# Patient Record
Sex: Female | Born: 1960 | Race: White | Hispanic: No | Marital: Single | Smoking: Current every day smoker
Health system: Southern US, Community
[De-identification: ages and names within clinical notes are randomized; demographics above are authoritative.]

## PROBLEM LIST (undated history)

## (undated) DIAGNOSIS — I10 Essential (primary) hypertension: Secondary | ICD-10-CM

## (undated) DIAGNOSIS — IMO0002 Reserved for concepts with insufficient information to code with codable children: Secondary | ICD-10-CM

## (undated) DIAGNOSIS — F32A Depression, unspecified: Secondary | ICD-10-CM

## (undated) DIAGNOSIS — Z86718 Personal history of other venous thrombosis and embolism: Secondary | ICD-10-CM

## (undated) DIAGNOSIS — F329 Major depressive disorder, single episode, unspecified: Secondary | ICD-10-CM

## (undated) DIAGNOSIS — M329 Systemic lupus erythematosus, unspecified: Secondary | ICD-10-CM

## (undated) HISTORY — PX: CHOLECYSTECTOMY: SHX55

## (undated) HISTORY — PX: APPENDECTOMY: SHX54

---
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
## 2019-02-23 ENCOUNTER — Encounter (HOSPITAL_COMMUNITY): Payer: Self-pay

## 2019-02-23 ENCOUNTER — Other Ambulatory Visit: Payer: Self-pay

## 2019-02-23 ENCOUNTER — Emergency Department (HOSPITAL_COMMUNITY): Payer: Medicare (Managed Care)

## 2019-02-23 ENCOUNTER — Inpatient Hospital Stay (HOSPITAL_COMMUNITY)
Admission: EM | Admit: 2019-02-23 | Discharge: 2019-02-25 | DRG: 392 | Disposition: A | Discharge status: Home / Self Care | Payer: Medicare (Managed Care) | Attending: Internal Medicine | Admitting: Internal Medicine

## 2019-02-23 DIAGNOSIS — Z7901 Long term (current) use of anticoagulants: Secondary | ICD-10-CM

## 2019-02-23 DIAGNOSIS — Z86718 Personal history of other venous thrombosis and embolism: Secondary | ICD-10-CM

## 2019-02-23 DIAGNOSIS — N179 Acute kidney failure, unspecified: Secondary | ICD-10-CM | POA: Diagnosis present

## 2019-02-23 DIAGNOSIS — R112 Nausea with vomiting, unspecified: Secondary | ICD-10-CM | POA: Diagnosis not present

## 2019-02-23 DIAGNOSIS — M329 Systemic lupus erythematosus, unspecified: Secondary | ICD-10-CM | POA: Diagnosis present

## 2019-02-23 DIAGNOSIS — F419 Anxiety disorder, unspecified: Secondary | ICD-10-CM | POA: Diagnosis present

## 2019-02-23 DIAGNOSIS — G43909 Migraine, unspecified, not intractable, without status migrainosus: Secondary | ICD-10-CM | POA: Diagnosis present

## 2019-02-23 DIAGNOSIS — I82409 Acute embolism and thrombosis of unspecified deep veins of unspecified lower extremity: Secondary | ICD-10-CM | POA: Diagnosis present

## 2019-02-23 DIAGNOSIS — Z23 Encounter for immunization: Secondary | ICD-10-CM

## 2019-02-23 DIAGNOSIS — Z20828 Contact with and (suspected) exposure to other viral communicable diseases: Secondary | ICD-10-CM | POA: Diagnosis present

## 2019-02-23 DIAGNOSIS — Z86711 Personal history of pulmonary embolism: Secondary | ICD-10-CM

## 2019-02-23 DIAGNOSIS — IMO0002 Reserved for concepts with insufficient information to code with codable children: Secondary | ICD-10-CM | POA: Diagnosis present

## 2019-02-23 DIAGNOSIS — E871 Hypo-osmolality and hyponatremia: Secondary | ICD-10-CM | POA: Diagnosis present

## 2019-02-23 DIAGNOSIS — Z79899 Other long term (current) drug therapy: Secondary | ICD-10-CM

## 2019-02-23 DIAGNOSIS — Z7951 Long term (current) use of inhaled steroids: Secondary | ICD-10-CM

## 2019-02-23 DIAGNOSIS — E876 Hypokalemia: Secondary | ICD-10-CM | POA: Diagnosis not present

## 2019-02-23 DIAGNOSIS — F17210 Nicotine dependence, cigarettes, uncomplicated: Secondary | ICD-10-CM | POA: Diagnosis present

## 2019-02-23 DIAGNOSIS — I10 Essential (primary) hypertension: Secondary | ICD-10-CM | POA: Diagnosis present

## 2019-02-23 DIAGNOSIS — Z9049 Acquired absence of other specified parts of digestive tract: Secondary | ICD-10-CM

## 2019-02-23 DIAGNOSIS — F12988 Cannabis use, unspecified with other cannabis-induced disorder: Secondary | ICD-10-CM | POA: Diagnosis present

## 2019-02-23 HISTORY — DX: Reserved for concepts with insufficient information to code with codable children: IMO0002

## 2019-02-23 HISTORY — DX: Personal history of other venous thrombosis and embolism: Z86.718

## 2019-02-23 HISTORY — DX: Depression, unspecified: F32.A

## 2019-02-23 HISTORY — DX: Essential (primary) hypertension: I10

## 2019-02-23 HISTORY — DX: Systemic lupus erythematosus, unspecified: M32.9

## 2019-02-23 LAB — URINALYSIS, ROUTINE W REFLEX MICROSCOPIC
Bilirubin Urine: NEGATIVE
Glucose, UA: NEGATIVE mg/dL
Ketones, ur: 80 mg/dL — AB
Leukocytes,Ua: NEGATIVE
Nitrite: NEGATIVE
Protein, ur: 30 mg/dL — AB
Specific Gravity, Urine: 1.016 (ref 1.005–1.030)
pH: 6 (ref 5.0–8.0)

## 2019-02-23 LAB — COMPREHENSIVE METABOLIC PANEL
ALT: 24 U/L (ref 0–44)
AST: 36 U/L (ref 15–41)
Albumin: 4.6 g/dL (ref 3.5–5.0)
Alkaline Phosphatase: 100 U/L (ref 38–126)
Anion gap: 23 — ABNORMAL HIGH (ref 5–15)
BUN: 59 mg/dL — ABNORMAL HIGH (ref 6–20)
CO2: 39 mmol/L — ABNORMAL HIGH (ref 22–32)
Calcium: 9.5 mg/dL (ref 8.9–10.3)
Chloride: 70 mmol/L — ABNORMAL LOW (ref 98–111)
Creatinine, Ser: 1.45 mg/dL — ABNORMAL HIGH (ref 0.44–1.00)
GFR calc Af Amer: 46 mL/min — ABNORMAL LOW (ref >60)
GFR calc non Af Amer: 39 mL/min — ABNORMAL LOW (ref >60)
Glucose, Bld: 104 mg/dL — ABNORMAL HIGH (ref 70–99)
Potassium: <2 mmol/L — CL (ref 3.5–5.1)
Sodium: 132 mmol/L — ABNORMAL LOW (ref 135–145)
Total Bilirubin: 3 mg/dL — ABNORMAL HIGH (ref 0.3–1.2)
Total Protein: 8.5 g/dL — ABNORMAL HIGH (ref 6.5–8.1)

## 2019-02-23 LAB — I-STAT BETA HCG BLOOD, ED (MC, WL, AP ONLY): I-stat hCG, quantitative: <5 m[IU]/mL (ref <5)

## 2019-02-23 LAB — CBC
HCT: 43.8 % (ref 36.0–46.0)
Hemoglobin: 15.2 g/dL — ABNORMAL HIGH (ref 12.0–15.0)
MCH: 32 pg (ref 26.0–34.0)
MCHC: 34.7 g/dL (ref 30.0–36.0)
MCV: 92.2 fL (ref 80.0–100.0)
Platelets: 289 10*3/uL (ref 150–400)
RBC: 4.75 MIL/uL (ref 3.87–5.11)
RDW: 12.3 % (ref 11.5–15.5)
WBC: 9.1 10*3/uL (ref 4.0–10.5)
nRBC: 0 % (ref 0.0–0.2)

## 2019-02-23 LAB — MAGNESIUM: Magnesium: 2.8 mg/dL — ABNORMAL HIGH (ref 1.7–2.4)

## 2019-02-23 LAB — LIPASE, BLOOD: Lipase: 26 U/L (ref 11–51)

## 2019-02-23 MED ORDER — ACETAMINOPHEN 650 MG RE SUPP: 650.0000 mg | Freq: Four times a day (QID) | RECTAL | Status: DC | PRN

## 2019-02-23 MED ORDER — ACETAMINOPHEN 325 MG PO TABS: 650.0000 mg | ORAL_TABLET | Freq: Four times a day (QID) | ORAL | Status: DC | PRN

## 2019-02-23 MED ORDER — METOCLOPRAMIDE HCL 5 MG/ML IJ SOLN
10.0000 mg | Freq: Once | INTRAMUSCULAR | Status: AC
  Administered 2019-02-23: 10 mg via INTRAVENOUS
  Filled 2019-02-23: qty 2

## 2019-02-23 MED ORDER — PANTOPRAZOLE SODIUM 40 MG IV SOLR
40.0000 mg | Freq: Once | INTRAVENOUS | Status: AC
  Administered 2019-02-23: 40 mg via INTRAVENOUS
  Filled 2019-02-23: qty 40

## 2019-02-23 MED ORDER — SODIUM CHLORIDE 0.9 % IV BOLUS
1000.0000 mL | Freq: Once | INTRAVENOUS | Status: AC
  Administered 2019-02-23: 19:00:00 1000 mL via INTRAVENOUS

## 2019-02-23 MED ORDER — IOHEXOL 300 MG/ML  SOLN
75.0000 mL | Freq: Once | INTRAMUSCULAR | Status: AC | PRN
  Administered 2019-02-23: 75 mL via INTRAVENOUS

## 2019-02-23 MED ORDER — POTASSIUM CHLORIDE 10 MEQ/100ML IV SOLN: 10.0000 meq | INTRAVENOUS | Status: DC

## 2019-02-23 MED ORDER — FENTANYL CITRATE (PF) 100 MCG/2ML IJ SOLN
50.0000 ug | Freq: Once | INTRAMUSCULAR | Status: AC
  Administered 2019-02-23: 23:00:00 50 ug via INTRAVENOUS
  Filled 2019-02-23: qty 2

## 2019-02-23 MED ORDER — ONDANSETRON HCL 4 MG PO TABS: 4.0000 mg | ORAL_TABLET | Freq: Four times a day (QID) | ORAL | Status: DC | PRN

## 2019-02-23 MED ORDER — POTASSIUM CHLORIDE 10 MEQ/100ML IV SOLN
10.0000 meq | INTRAVENOUS | Status: AC
  Administered 2019-02-23 (×3): 10 meq via INTRAVENOUS
  Filled 2019-02-23 (×3): qty 100

## 2019-02-23 MED ORDER — POTASSIUM CHLORIDE 2 MEQ/ML IV SOLN
INTRAVENOUS | Status: AC
  Administered 2019-02-24 (×2): via INTRAVENOUS
  Filled 2019-02-23 (×3): qty 1000

## 2019-02-23 MED ORDER — FENTANYL CITRATE (PF) 100 MCG/2ML IJ SOLN
25.0000 ug | INTRAMUSCULAR | Status: DC | PRN
  Administered 2019-02-24 (×3): 25 ug via INTRAVENOUS
  Filled 2019-02-23 (×4): qty 2

## 2019-02-23 MED ORDER — POTASSIUM CHLORIDE CRYS ER 20 MEQ PO TBCR
40.0000 meq | EXTENDED_RELEASE_TABLET | Freq: Once | ORAL | Status: AC
  Administered 2019-02-23: 40 meq via ORAL
  Filled 2019-02-23: qty 2

## 2019-02-23 MED ORDER — ONDANSETRON HCL 4 MG/2ML IJ SOLN
4.0000 mg | Freq: Once | INTRAMUSCULAR | Status: AC
  Administered 2019-02-23: 4 mg via INTRAVENOUS
  Filled 2019-02-23: qty 2

## 2019-02-23 MED ORDER — ONDANSETRON HCL 4 MG/2ML IJ SOLN
4.0000 mg | Freq: Four times a day (QID) | INTRAMUSCULAR | Status: DC | PRN
  Administered 2019-02-24 (×2): 4 mg via INTRAVENOUS
  Filled 2019-02-23 (×3): qty 2

## 2019-02-23 MED ORDER — LABETALOL HCL 5 MG/ML IV SOLN
10.0000 mg | INTRAVENOUS | Status: DC | PRN
  Filled 2019-02-23: qty 4

## 2019-02-23 MED ORDER — UMECLIDINIUM BROMIDE 62.5 MCG/INH IN AEPB
1.0000 | INHALATION_SPRAY | Freq: Every day | RESPIRATORY_TRACT | Status: DC
  Administered 2019-02-24 – 2019-02-25 (×2): 1 via RESPIRATORY_TRACT
  Filled 2019-02-23: qty 7

## 2019-02-23 MED ORDER — MOMETASONE FURO-FORMOTEROL FUM 100-5 MCG/ACT IN AERO
2.0000 | INHALATION_SPRAY | Freq: Two times a day (BID) | RESPIRATORY_TRACT | Status: DC
  Administered 2019-02-24 – 2019-02-25 (×2): 2 via RESPIRATORY_TRACT
  Filled 2019-02-23: qty 8.8

## 2019-02-23 MED ORDER — ALBUTEROL SULFATE HFA 108 (90 BASE) MCG/ACT IN AERS: 1.0000 | INHALATION_SPRAY | Freq: Four times a day (QID) | RESPIRATORY_TRACT | Status: DC | PRN

## 2019-02-23 NOTE — ED Notes (Signed)
EKG given to EDP,Belfi,MD., for review. 

## 2019-02-23 NOTE — ED Provider Notes (Deleted)
[facility]-EMERGENCY DEPT Provider Note   CSN: [id number] Arrival date & time: 02/23/19  1749     History   Chief Complaint Chief Complaint  Patient presents with  . Emesis    for past 5 days    HPI Kathleen Gomez is a 58 y.o. female.     HPI  Past Medical History:  Diagnosis Date  . Depression   . H/O blood clots   . Hypertension   . Lupus (HCC)     There are no active problems to display for this patient.   Past Surgical History:  Procedure Laterality Date  . APPENDECTOMY    . CHOLECYSTECTOMY       OB History   No obstetric history on file.      Home Medications    Prior to Admission medications   Medication Sig Start Date End Date Taking? Authorizing Provider  albuterol (VENTOLIN HFA) 108 (90 Base) MCG/ACT inhaler Inhale 1 puff into the lungs every 6 (six) hours as needed for wheezing or shortness of breath.  01/23/19  Yes [provider]  ALPRAZolam Prudy Feeler(XANAX) 0.5 MG tablet Take 0.5 mg by mouth 2 (two) times daily as needed for anxiety or sleep.  02/01/19  Yes [provider]  cloNIDine (CATAPRES) 0.1 MG tablet Take 0.1 mg by mouth 2 (two) times daily. 12/30/18  Yes [provider]  escitalopram (LEXAPRO) 10 MG tablet Take 10 mg by mouth daily. 01/23/19  Yes [provider]  gabapentin (NEURONTIN) 600 MG tablet Take 600 mg by mouth 3 (three) times daily. 12/30/18  Yes [provider]  mirtazapine (REMERON) 45 MG tablet Take 45 mg by mouth at bedtime. 01/24/19  Yes [provider]  potassium chloride (MICRO-K) 10 MEQ CR capsule Take 10 mEq by mouth daily. 01/23/19  Yes [provider]  pravastatin (PRAVACHOL) 10 MG tablet Take 10 mg by mouth at bedtime. 11/28/18  Yes [provider]  SPIRIVA HANDIHALER 18 MCG inhalation capsule Place 1 puff into inhaler and inhale daily. 01/24/19  Yes [provider]  SYMBICORT 80-4.5 MCG/ACT inhaler Inhale 1 puff into the lungs 2  (two) times daily. 01/23/19  Yes [provider]  topiramate (TOPAMAX) 25 MG tablet Take 25 mg by mouth at bedtime. 01/23/19  Yes [provider]  XARELTO 20 MG TABS tablet Take 20 mg by mouth daily. 02/23/19  Yes [provider]    Family History History reviewed. No pertinent family history.  Social History Social History   Tobacco Use  . Smoking status: Current Every Day Smoker    Packs/day: 1.00    Types: Cigarettes  . Smokeless tobacco: Never Used  Substance Use Topics  . Alcohol use: Not Currently  . Drug use: Yes    Types: Marijuana     Allergies   Patient has no known allergies.   Review of Systems Review of Systems   Physical Exam Updated Vital Signs BP (!) 145/65 (BP Location: Left Arm)   Pulse 96   Temp 98.3 F (36.8 C) (Oral)   Resp 18   SpO2 93%   Physical Exam   ED Treatments / Results  Labs (all labs ordered are listed, but only abnormal results are displayed) Labs Reviewed  COMPREHENSIVE METABOLIC PANEL - Abnormal; Notable for the following components:      Result Value   Sodium 132 (*)    Potassium <2.0 (*)    Chloride 70 (*)    CO2 39 (*)  Glucose, Bld 104 (*)    BUN 59 (*)    Creatinine, Ser 1.45 (*)    Total Protein 8.5 (*)    Total Bilirubin 3.0 (*)    GFR calc non Af Amer 39 (*)    GFR calc Af Amer 46 (*)    Anion gap 23 (*)    All other components within normal limits  CBC - Abnormal; Notable for the following components:   Hemoglobin 15.2 (*)    All other components within normal limits  URINALYSIS, ROUTINE W REFLEX MICROSCOPIC - Abnormal; Notable for the following components:   Hgb urine dipstick MODERATE (*)    Ketones, ur 80 (*)    Protein, ur 30 (*)    Bacteria, UA RARE (*)    All other components within normal limits  MAGNESIUM - Abnormal; Notable for the following components:   Magnesium 2.8 (*)    All other components within normal limits  SARS CORONAVIRUS 2 (TAT 6-24 HRS)  LIPASE,  BLOOD  I-STAT BETA HCG BLOOD, ED ([facility], [facility], [facility] ONLY)    EKG None  Radiology Ct Abdomen Pelvis W Contrast  Result Date: 02/23/2019 CLINICAL DATA:  Abdominal pain EXAM: CT ABDOMEN AND PELVIS WITH CONTRAST TECHNIQUE: Multidetector CT imaging of the abdomen and pelvis was performed using the standard protocol following bolus administration of intravenous contrast. CONTRAST:  39mL OMNIPAQUE IOHEXOL 300 MG/ML  SOLN COMPARISON:  None. FINDINGS: Lower chest: Lung bases are clear. No effusions. Heart is normal size. Hepatobiliary: Prior cholecystectomy. Diffuse fatty infiltration of the liver. No focal hepatic abnormality. Mild intrahepatic and extrahepatic biliary prominence, likely related to post cholecystectomy state. Pancreas: No focal abnormality or ductal dilatation. Spleen: No focal abnormality.  Normal size. Adrenals/Urinary Tract: No adrenal abnormality. No focal renal abnormality. No stones or hydronephrosis. Urinary bladder is unremarkable. Stomach/Bowel: Stomach, large and small bowel grossly unremarkable. Prior appendectomy. Vascular/Lymphatic: Aortic atherosclerosis. No enlarged abdominal or pelvic lymph nodes. IVC filter in place. Reproductive: Uterus and adnexa unremarkable.  No mass. Other: No free fluid or free air. Musculoskeletal: No acute bony abnormality. IMPRESSION: Fatty infiltration of the liver. Aortic atherosclerosis. No acute findings. Electronically Signed   By: Rolm Baptise M.D.   On: 02/23/2019 21:39    Procedures Procedures (including critical care time)  Medications Ordered in ED Medications  potassium chloride 10 mEq in 100 mL IVPB (10 mEq Intravenous New Bag/Given 02/23/19 2235)  sodium chloride 0.9 % bolus 1,000 mL (0 mLs Intravenous Stopped 02/23/19 1902)  ondansetron (ZOFRAN) injection 4 mg (4 mg Intravenous Given 02/23/19 1833)  pantoprazole (PROTONIX) injection 40 mg (40 mg Intravenous Given 02/23/19 1833)  potassium chloride SA (KLOR-CON) CR tablet 40 mEq (40 mEq  Oral Given 02/23/19 2240)  iohexol (OMNIPAQUE) 300 MG/ML solution 75 mL (75 mLs Intravenous Contrast Given 02/23/19 2122)  metoCLOPramide (REGLAN) injection 10 mg (10 mg Intravenous Given 02/23/19 2237)  fentaNYL (SUBLIMAZE) injection 50 mcg (50 mcg Intravenous Given 02/23/19 2234)     Initial Impression / Assessment and Plan / ED Course  I have reviewed the triage vital signs and the nursing notes.  Pertinent labs & imaging results that were available during my care of the patient were reviewed by me and considered in my medical decision making (see chart for details).        Patient is a 58 year old female who presents with nausea vomiting and upper abdominal pain.  She was noted to have a marked hypokalemia with a potassium less than 2.  She was  started on IV and oral potassium supplementation.  Her magnesium level is okay.  CT scan of her abdomen pelvis show no acute abnormalities.  She has a mild elevation in her creatinine.  She was given IV fluids and antiemetics.  She still feels nauseated.  She was also given some pain medication for her abdominal pain.  She states that she has had problems in the past where she is had ongoing vomiting and she does not know what is caused it but she is had to be admitted for it in the past in [place].  She denies any recent fevers or other Covid symptoms.  I spoke with Dr. Toniann Fail who admit the patient for further treatment  CRITICAL CARE Performed by: Rolan Bucco Total critical care time: 45 minutes Critical care time was exclusive of separately billable procedures and treating other patients. Critical care was necessary to treat or prevent imminent or life-threatening deterioration. Critical care was time spent personally by me on the following activities: development of treatment plan with patient and/or surrogate as well as nursing, discussions with consultants, evaluation of patient's response to treatment, examination of patient, obtaining  history from patient or surrogate, ordering and performing treatments and interventions, ordering and review of laboratory studies, ordering and review of radiographic studies, pulse oximetry and re-evaluation of patient's condition.  Kenley Rettinger was evaluated in Emergency Department on 02/23/2019 for the symptoms described in the history of present illness. She was evaluated in the context of the global COVID-19 pandemic, which necessitated consideration that the patient might be at risk for infection with the SARS-CoV-2 virus that causes COVID-19. Institutional protocols and algorithms that pertain to the evaluation of patients at risk for COVID-19 are in a state of rapid change based on information released by regulatory bodies including the CDC and federal and state organizations. These policies and algorithms were followed during the patient's care in the ED.    Final Clinical Impressions(s) / ED Diagnoses   Final diagnoses:  Intractable vomiting with nausea, unspecified vomiting type  Hypokalemia    ED Discharge Orders    None       Rolan Bucco, MD 02/23/19 2243

## 2019-02-23 NOTE — Progress Notes (Signed)
ANTICOAGULATION CONSULT NOTE - Initial Consult  Pharmacy Consult for IV heparin Indication: DVT ( on PTA xarelto)  No Known Allergies  Patient Measurements:   Heparin Dosing Weight: 63.8 (actual)  Vital Signs: Temp: 98.3 F (36.8 C) (11/29 1814) Temp Source: Oral (11/29 1814) BP: 151/85 (11/29 2230) Pulse Rate: 100 (11/29 2230)  Labs: Recent Labs    02/23/19 1808  HGB 15.2*  HCT 43.8  PLT 289  CREATININE 1.45*    CrCl cannot be calculated (Unknown ideal weight.).   Medical History: Past Medical History:  Diagnosis Date  . Depression   . H/O blood clots   . Hypertension   . Lupus (HCC)     Medications:  Scheduled:  . mometasone-formoterol  2 puff Inhalation BID  . [START ON 02/24/2019] tiotropium  18 mcg Inhalation Daily   Infusions:  . lactated ringers 1,000 mL with potassium chloride 40 mEq infusion    . potassium chloride 10 mEq (02/23/19 2235)    Assessment: 17 yoF c/o emesis x 5 day on chronic xarelto for hx of DVTs.  LD xarelto last week Baseline labs: H/H, plts WNL, aptt =22 , INR =1, HL = <0.10  Goal of Therapy:  Heparin level 0.3-0.7 units/ml aPTT 66-102 seconds Monitor platelets by anticoagulation protocol: Yes   Plan:  Baselines pending, but per med rec patient has not had xarelto in a few days Heparin 2000 unit bolus x1 Start drip at 1100 units/hr Daily CBC/HL Check 1st HL in 6 hours  Dorrene German 02/23/2019,11:07 PM

## 2019-02-23 NOTE — ED Notes (Signed)
Pt. Documented in error see above note in chart. 

## 2019-02-23 NOTE — H&P (Addendum)
History and Physical    Kathleen Gomez RDE:[id number] DOB: Oct 04, 1960 DOA: 02/23/2019  PCP: System, Pcp Not In  Patient coming from: Home.  Chief Complaint: Abdominal pain nausea vomiting.  HPI: Kathleen Gomez is a 58 y.o. female with history of lupus, hypertension, DVT/PE, depression anxiety and bronchitis presents to the ER with complaints of having persistent abdominal pain with nausea vomiting unable to keep anything.  Patient has been having abdominal pain for the last 2 weeks over the last 5 days has been having persistent vomiting but denies any diarrhea.  Unable to eat anything.  Pain is diffuse.  Denies any blood in the vomitus.  Patient states he had a similar episode 3 years ago when patient was in [place].  At that time patient had EGD and colonoscopy and as per the patient it was unremarkable.  We do not have any access to her records.  Patient just moved from [place] 2 months ago.  ED Course: In the ER patient had diffuse abdominal pain with CT scan of the abdomen pelvis showing nothing acute.  No potassium less than 2 EKG showing sinus tachycardia with nonspecific ST-T changes creatinine 1.4 total bilirubin of 3 lipase of 26 platelets 289 hemoglobin 15.2 WBC 9.1 EKG sinus tachycardia COVID-19 test is pending.  Patient was started on IV fluids pain relief medication and admitted for further management.  Urine drug screen is positive for marijuana.  Review of Systems: As per HPI, rest all negative.   Past Medical History:  Diagnosis Date  . Depression   . H/O blood clots   . Hypertension   . Lupus [facility])     Past Surgical History:  Procedure Laterality Date  . APPENDECTOMY    . CHOLECYSTECTOMY       reports that she has been smoking cigarettes. She has been smoking about 1.00 pack per day. She has never used smokeless tobacco. She reports previous alcohol use. She reports current drug use. Drug: Marijuana.  No Known Allergies  Family History  Problem Relation Age of  Onset  . Diabetes Mellitus II Father     Prior to Admission medications   Medication Sig Start Date End Date Taking? Authorizing Provider  albuterol (VENTOLIN HFA) 108 (90 Base) MCG/ACT inhaler Inhale 1 puff into the lungs every 6 (six) hours as needed for wheezing or shortness of breath.  01/23/19  Yes [provider]  ALPRAZolam Duanne Moron) 0.5 MG tablet Take 0.5 mg by mouth 2 (two) times daily as needed for anxiety or sleep.  02/01/19  Yes [provider]  cloNIDine (CATAPRES) 0.1 MG tablet Take 0.1 mg by mouth 2 (two) times daily. 12/30/18  Yes [provider]  escitalopram (LEXAPRO) 10 MG tablet Take 10 mg by mouth daily. 01/23/19  Yes [provider]  gabapentin (NEURONTIN) 600 MG tablet Take 600 mg by mouth 3 (three) times daily. 12/30/18  Yes [provider]  mirtazapine (REMERON) 45 MG tablet Take 45 mg by mouth at bedtime. 01/24/19  Yes [provider]  potassium chloride (MICRO-K) 10 MEQ CR capsule Take 10 mEq by mouth daily. 01/23/19  Yes [provider]  pravastatin (PRAVACHOL) 10 MG tablet Take 10 mg by mouth at bedtime. 11/28/18  Yes [provider]  SPIRIVA HANDIHALER 18 MCG inhalation capsule Place 1 puff into inhaler and inhale daily. 01/24/19  Yes [provider]  SYMBICORT 80-4.5 MCG/ACT inhaler Inhale 1 puff into the lungs 2 (two) times daily. 01/23/19  Yes [provider]  topiramate (TOPAMAX) 25 MG tablet Take 25 mg by mouth at bedtime. 01/23/19  Yes [provider]  XARELTO 20 MG TABS tablet Take 20 mg by mouth daily. 02/23/19  Yes [provider]    Physical Exam: Constitutional: Moderately built and nourished. Vitals:   02/23/19 1814 02/23/19 1830 02/23/19 2150 02/23/19 2230  BP: (!) 147/106 (!) 156/94 (!) 145/65 (!) 151/85  Pulse: (!) 108 (!) 113 96 100  Resp: 17  18 18   Temp: 98.3 F (36.8 C)     TempSrc: Oral     SpO2: 91% 92% 93% 94%   Eyes: Anicteric no  pallor. ENMT: No discharge from the ears eyes nose or mouth. Neck: No mass felt.  No neck rigidity. Respiratory: No rhonchi or crepitations. Cardiovascular: S1-S2 heard. Abdomen: Soft nontender bowel sounds present. Musculoskeletal: No edema bilateral effusion. Skin: No rash. Neurologic: Alert awake oriented to time place and person.  Moves all extremities. Psychiatric: Appears normal per normal affect.   Labs on Admission: I have personally reviewed following labs and imaging studies  CBC: Recent Labs  Lab 02/23/19 1808  WBC 9.1  HGB 15.2*  HCT 43.8  MCV 92.2  PLT 289   Basic Metabolic Panel: Recent Labs  Lab 02/23/19 1808  NA 132*  K <2.0*  CL 70*  CO2 39*  GLUCOSE 104*  BUN 59*  CREATININE 1.45*  CALCIUM 9.5  MG 2.8*   GFR: CrCl cannot be calculated (Unknown ideal weight.). Liver Function Tests: Recent Labs  Lab 02/23/19 1808  AST 36  ALT 24  ALKPHOS 100  BILITOT 3.0*  PROT 8.5*  ALBUMIN 4.6   Recent Labs  Lab 02/23/19 1808  LIPASE 26   No results for input(s): AMMONIA in the last 168 hours. Coagulation Profile: No results for input(s): INR, PROTIME in the last 168 hours. Cardiac Enzymes: No results for input(s): CKTOTAL, CKMB, CKMBINDEX, TROPONINI in the last 168 hours. BNP (last 3 results) No results for input(s): PROBNP in the last 8760 hours. HbA1C: No results for input(s): HGBA1C in the last 72 hours. CBG: No results for input(s): GLUCAP in the last 168 hours. Lipid Profile: No results for input(s): CHOL, HDL, LDLCALC, TRIG, CHOLHDL, LDLDIRECT in the last 72 hours. Thyroid Function Tests: No results for input(s): TSH, T4TOTAL, FREET4, T3FREE, THYROIDAB in the last 72 hours. Anemia Panel: No results for input(s): VITAMINB12, FOLATE, FERRITIN, TIBC, IRON, RETICCTPCT in the last 72 hours. Urine analysis:    Component Value Date/Time   COLORURINE YELLOW 02/23/2019 1808   APPEARANCEUR CLEAR 02/23/2019 1808   LABSPEC 1.016 02/23/2019  1808   PHURINE 6.0 02/23/2019 1808   GLUCOSEU NEGATIVE 02/23/2019 1808   HGBUR MODERATE (A) 02/23/2019 1808   BILIRUBINUR NEGATIVE 02/23/2019 1808   KETONESUR 80 (A) 02/23/2019 1808   PROTEINUR 30 (A) 02/23/2019 1808   NITRITE NEGATIVE 02/23/2019 1808   LEUKOCYTESUR NEGATIVE 02/23/2019 1808   Sepsis Labs: @LABRCNTIP (procalcitonin:4,lacticidven:4) )No results found for this or any previous visit (from the past 240 hour(s)).   Radiological Exams on Admission: Ct Abdomen Pelvis W Contrast  Result Date: 02/23/2019 CLINICAL DATA:  Abdominal pain EXAM: CT ABDOMEN AND PELVIS WITH CONTRAST TECHNIQUE: Multidetector CT imaging of the abdomen and pelvis was performed using the standard protocol following bolus administration of intravenous contrast. CONTRAST:  26mL OMNIPAQUE IOHEXOL 300 MG/ML  SOLN COMPARISON:  None. FINDINGS: Lower chest: Lung bases are clear. No effusions. Heart is normal size. Hepatobiliary: Prior cholecystectomy. Diffuse fatty infiltration of the liver. No focal hepatic  abnormality. Mild intrahepatic and extrahepatic biliary prominence, likely related to post cholecystectomy state. Pancreas: No focal abnormality or ductal dilatation. Spleen: No focal abnormality.  Normal size. Adrenals/Urinary Tract: No adrenal abnormality. No focal renal abnormality. No stones or hydronephrosis. Urinary bladder is unremarkable. Stomach/Bowel: Stomach, large and small bowel grossly unremarkable. Prior appendectomy. Vascular/Lymphatic: Aortic atherosclerosis. No enlarged abdominal or pelvic lymph nodes. IVC filter in place. Reproductive: Uterus and adnexa unremarkable.  No mass. Other: No free fluid or free air. Musculoskeletal: No acute bony abnormality. IMPRESSION: Fatty infiltration of the liver. Aortic atherosclerosis. No acute findings. Electronically Signed   By: Charlett NoseKevin  Dover M.D.   On: 02/23/2019 21:39    EKG: Independently reviewed.  Sinus tachycardia with nonspecific T changes.   Assessment/Plan Principal Problem:   Nausea & vomiting Active Problems:   Hypokalemia   DVT (deep venous thrombosis) (HCC)   Anxiety   Lupus (HCC)   Migraine    1. Abdominal pain with intractable nausea vomiting cause not clear.  We will keep patient n.p.o. and pain to the medication IV fluids.  If persistent may consult gastroenterologist in the morning.  Follow LFTs metabolic panel. 2. Severe hypokalemia likely from vomiting.  Replace recheck. 3. Acute renal failure likely from unable to take anything orally.  Continue with hydration and follow metabolic panel after hydration and intake output. 4. History of DVT/PE on Xarelto which patient is unable to take because of persistent vomiting.  We will keep patient on heparin for now and patient is agreeable. 5. History of hypertension we will keep patient on as needed IV hydralazine since patient is unable to take orally. 6. History of migraine on Topamax. 7. History of lupus presently not on medications.  COVID-19 test is pending.   DVT prophylaxis: Heparin infusion. Code Status: Full code. Family Communication: Discussed with patient. Disposition Plan: Home. Consults called: None. Admission status: Inpatient.   Eduard ClosArshad N Charlann Wayne MD [facility] Pager [phone].  If 7PM-7AM, please contact night-coverage [email or web address] Password [email or web address]  02/23/2019, 11:01 PM

## 2019-02-23 NOTE — ED Notes (Addendum)
CRITICAL VALUE STICKER  CRITICAL VALUE: K+ <2  RECEIVER (on-site recipient of call): Jake T RN  DATE & TIME NOTIFIED: 02/23/19 713p  MESSENGER (representative from lab): Ulice Dash  MD NOTIFIED: Tamera Punt MD  TIME OF NOTIFICATION: 713p  RESPONSE: see orders

## 2019-02-23 NOTE — ED Provider Notes (Signed)
[facility]-EMERGENCY DEPT Provider Note   CSN: [id number] Arrival date & time: 02/23/19  1749     History   Chief Complaint Chief Complaint  Patient presents with   Emesis    for past 5 days    HPI Kathleen Gomez is a 58 y.o. female.     Patient is a 58 year old female who presents with nausea and vomiting.  She reports a 5-day history of nausea and vomiting associate with abdominal pain.  Her abdominal pain is mostly right-sided.  She has had some nonbilious, nonbloody emesis and has not really been able to keep much down.  She denies any diarrhea.  No fevers.  No cough or cold symptoms.  She is visiting from [place] to take care of her father.  She has had a cholecystectomy in the past.  She denies any other abdominal surgeries.     Past Medical History:  Diagnosis Date   Depression    H/O blood clots    Hypertension    Lupus [facility])     Patient Active Problem List   Diagnosis Date Noted   Nausea & vomiting 02/23/2019   Hypokalemia 02/23/2019   DVT (deep venous thrombosis) (HCC) 02/23/2019   Anxiety 02/23/2019   Lupus (HCC) 02/23/2019   Migraine 02/23/2019    Past Surgical History:  Procedure Laterality Date   APPENDECTOMY     CHOLECYSTECTOMY       OB History   No obstetric history on file.      Home Medications    Prior to Admission medications   Medication Sig Start Date End Date Taking? Authorizing Provider  albuterol (VENTOLIN HFA) 108 (90 Base) MCG/ACT inhaler Inhale 1 puff into the lungs every 6 (six) hours as needed for wheezing or shortness of breath.  01/23/19  Yes [provider]  ALPRAZolam Prudy Feeler) 0.5 MG tablet Take 0.5 mg by mouth 2 (two) times daily as needed for anxiety or sleep.  02/01/19  Yes [provider]  cloNIDine (CATAPRES) 0.1 MG tablet Take 0.1 mg by mouth 2 (two) times daily. 12/30/18  Yes [provider]  escitalopram (LEXAPRO) 10 MG tablet Take 10 mg by mouth daily.  01/23/19  Yes [provider]  gabapentin (NEURONTIN) 600 MG tablet Take 600 mg by mouth 3 (three) times daily. 12/30/18  Yes [provider]  mirtazapine (REMERON) 45 MG tablet Take 45 mg by mouth at bedtime. 01/24/19  Yes [provider]  potassium chloride (MICRO-K) 10 MEQ CR capsule Take 10 mEq by mouth daily. 01/23/19  Yes [provider]  pravastatin (PRAVACHOL) 10 MG tablet Take 10 mg by mouth at bedtime. 11/28/18  Yes [provider]  SPIRIVA HANDIHALER 18 MCG inhalation capsule Place 1 puff into inhaler and inhale daily. 01/24/19  Yes [provider]  SYMBICORT 80-4.5 MCG/ACT inhaler Inhale 1 puff into the lungs 2 (two) times daily. 01/23/19  Yes [provider]  topiramate (TOPAMAX) 25 MG tablet Take 25 mg by mouth at bedtime. 01/23/19  Yes [provider]  XARELTO 20 MG TABS tablet Take 20 mg by mouth daily. 02/23/19  Yes [provider]    Family History Family History  Problem Relation Age of Onset   Diabetes Mellitus II Father     Social History Social History   Tobacco Use   Smoking status: Current Every Day Smoker    Packs/day: 1.00    Types: Cigarettes   Smokeless tobacco: Never Used  Substance Use Topics  Alcohol use: Not Currently   Drug use: Yes    Types: Marijuana     Allergies   Patient has no known allergies.   Review of Systems Review of Systems  Constitutional: Negative for chills, diaphoresis, fatigue and fever.  HENT: Negative for congestion, rhinorrhea and sneezing.   Eyes: Negative.   Respiratory: Negative for cough, chest tightness and shortness of breath.   Cardiovascular: Negative for chest pain and leg swelling.  Gastrointestinal: Positive for abdominal pain, nausea and vomiting. Negative for blood in stool and diarrhea.  Genitourinary: Negative for difficulty urinating, flank pain, frequency and hematuria.  Musculoskeletal: Negative for arthralgias and  back pain.  Skin: Negative for rash.  Neurological: Negative for dizziness, speech difficulty, weakness, numbness and headaches.     Physical Exam Updated Vital Signs BP (!) 151/85    Pulse 100    Temp 98.3 F (36.8 C) (Oral)    Resp 18    SpO2 94%   Physical Exam Constitutional:      Appearance: She is well-developed.  HENT:     Head: Normocephalic and atraumatic.  Eyes:     Pupils: Pupils are equal, round, and reactive to light.  Neck:     Musculoskeletal: Normal range of motion and neck supple.  Cardiovascular:     Rate and Rhythm: Normal rate and regular rhythm.     Heart sounds: Normal heart sounds.  Pulmonary:     Effort: Pulmonary effort is normal. No respiratory distress.     Breath sounds: Normal breath sounds. No wheezing or rales.  Chest:     Chest wall: No tenderness.  Abdominal:     General: Bowel sounds are normal.     Palpations: Abdomen is soft.     Tenderness: There is abdominal tenderness (right mid abdomen). There is no guarding or rebound.  Musculoskeletal: Normal range of motion.  Lymphadenopathy:     Cervical: No cervical adenopathy.  Skin:    General: Skin is warm and dry.     Findings: No rash.  Neurological:     Mental Status: She is alert and oriented to person, place, and time.      ED Treatments / Results  Labs (all labs ordered are listed, but only abnormal results are displayed) Labs Reviewed  COMPREHENSIVE METABOLIC PANEL - Abnormal; Notable for the following components:      Result Value   Sodium 132 (*)    Potassium <2.0 (*)    Chloride 70 (*)    CO2 39 (*)    Glucose, Bld 104 (*)    BUN 59 (*)    Creatinine, Ser 1.45 (*)    Total Protein 8.5 (*)    Total Bilirubin 3.0 (*)    GFR calc non Af Amer 39 (*)    GFR calc Af Amer 46 (*)    Anion gap 23 (*)    All other components within normal limits  CBC - Abnormal; Notable for the following components:   Hemoglobin 15.2 (*)    All other components within normal limits    URINALYSIS, ROUTINE W REFLEX MICROSCOPIC - Abnormal; Notable for the following components:   Hgb urine dipstick MODERATE (*)    Ketones, ur 80 (*)    Protein, ur 30 (*)    Bacteria, UA RARE (*)    All other components within normal limits  MAGNESIUM - Abnormal; Notable for the following components:   Magnesium 2.8 (*)    All other components within normal limits  SARS CORONAVIRUS 2 (TAT 6-24 HRS)  LIPASE, BLOOD  HIV ANTIBODY (ROUTINE TESTING W REFLEX)  BASIC METABOLIC PANEL  HEPATIC FUNCTION PANEL  CBC WITH DIFFERENTIAL/PLATELET  APTT  PROTIME-INR  HEPARIN LEVEL (UNFRACTIONATED)  RAPID URINE DRUG SCREEN, HOSP PERFORMED  I-STAT BETA HCG BLOOD, ED ([facility], [facility], [facility] ONLY)    EKG None  Radiology Ct Abdomen Pelvis W Contrast  Result Date: 02/23/2019 CLINICAL DATA:  Abdominal pain EXAM: CT ABDOMEN AND PELVIS WITH CONTRAST TECHNIQUE: Multidetector CT imaging of the abdomen and pelvis was performed using the standard protocol following bolus administration of intravenous contrast. CONTRAST:  62mL OMNIPAQUE IOHEXOL 300 MG/ML  SOLN COMPARISON:  None. FINDINGS: Lower chest: Lung bases are clear. No effusions. Heart is normal size. Hepatobiliary: Prior cholecystectomy. Diffuse fatty infiltration of the liver. No focal hepatic abnormality. Mild intrahepatic and extrahepatic biliary prominence, likely related to post cholecystectomy state. Pancreas: No focal abnormality or ductal dilatation. Spleen: No focal abnormality.  Normal size. Adrenals/Urinary Tract: No adrenal abnormality. No focal renal abnormality. No stones or hydronephrosis. Urinary bladder is unremarkable. Stomach/Bowel: Stomach, large and small bowel grossly unremarkable. Prior appendectomy. Vascular/Lymphatic: Aortic atherosclerosis. No enlarged abdominal or pelvic lymph nodes. IVC filter in place. Reproductive: Uterus and adnexa unremarkable.  No mass. Other: No free fluid or free air. Musculoskeletal: No acute bony abnormality.  IMPRESSION: Fatty infiltration of the liver. Aortic atherosclerosis. No acute findings. Electronically Signed   By: Rolm Baptise M.D.   On: 02/23/2019 21:39    Procedures Procedures (including critical care time)  Medications Ordered in ED Medications  potassium chloride 10 mEq in 100 mL IVPB (10 mEq Intravenous New Bag/Given 02/23/19 2235)  albuterol (VENTOLIN HFA) 108 (90 Base) MCG/ACT inhaler 1 puff (has no administration in time range)  tiotropium (SPIRIVA) inhalation capsule ([facility] use ONLY) 18 mcg (has no administration in time range)  mometasone-formoterol (DULERA) 100-5 MCG/ACT inhaler 2 puff (has no administration in time range)  acetaminophen (TYLENOL) tablet 650 mg (has no administration in time range)    Or  acetaminophen (TYLENOL) suppository 650 mg (has no administration in time range)  ondansetron (ZOFRAN) tablet 4 mg (has no administration in time range)    Or  ondansetron (ZOFRAN) injection 4 mg (has no administration in time range)  fentaNYL (SUBLIMAZE) injection 25 mcg (has no administration in time range)  lactated ringers 1,000 mL with potassium chloride 40 mEq infusion (has no administration in time range)  labetalol (NORMODYNE) injection 10 mg (has no administration in time range)  sodium chloride 0.9 % bolus 1,000 mL (0 mLs Intravenous Stopped 02/23/19 1902)  ondansetron (ZOFRAN) injection 4 mg (4 mg Intravenous Given 02/23/19 1833)  pantoprazole (PROTONIX) injection 40 mg (40 mg Intravenous Given 02/23/19 1833)  potassium chloride SA (KLOR-CON) CR tablet 40 mEq (40 mEq Oral Given 02/23/19 2240)  iohexol (OMNIPAQUE) 300 MG/ML solution 75 mL (75 mLs Intravenous Contrast Given 02/23/19 2122)  metoCLOPramide (REGLAN) injection 10 mg (10 mg Intravenous Given 02/23/19 2237)  fentaNYL (SUBLIMAZE) injection 50 mcg (50 mcg Intravenous Given 02/23/19 2234)     Initial Impression / Assessment and Plan / ED Course  I have reviewed the triage vital signs and the nursing  notes.  Pertinent labs & imaging results that were available during my care of the patient were reviewed by me and considered in my medical decision making (see chart for details).        Patient is a 58 year old female who presents with nausea vomiting abdominal pain.  Her labs show a  significant hypokalemia.  She was started on IV and oral potassium replacement.  She was given IV fluids and antiemetics.  Her CT scan does not show any acute abnormality.  Her other labs are nonconcerning.  She does have mild elevation in her creatinine but I do not know what her baseline is.  She does report that she has had admissions for similar symptoms in the past in [place].  Etiology has been unknown.  I spoke with Dr. Toniann Fail who will admit the patient for further treatment.  She does not have any cough fever shortness of breath or other Covid type symptoms.  CRITICAL CARE Performed by: Rolan Bucco Total critical care time: 45 minutes Critical care time was exclusive of separately billable procedures and treating other patients. Critical care was necessary to treat or prevent imminent or life-threatening deterioration. Critical care was time spent personally by me on the following activities: development of treatment plan with patient and/or surrogate as well as nursing, discussions with consultants, evaluation of patient's response to treatment, examination of patient, obtaining history from patient or surrogate, ordering and performing treatments and interventions, ordering and review of laboratory studies, ordering and review of radiographic studies, pulse oximetry and re-evaluation of patient's condition.   Final Clinical Impressions(s) / ED Diagnoses   Final diagnoses:  Intractable vomiting with nausea, unspecified vomiting type  Hypokalemia    ED Discharge Orders    None       Rolan Bucco, MD 02/23/19 2322

## 2019-02-23 NOTE — ED Triage Notes (Signed)
Arrived by [facility] from home. Visiting her  father [place] from [place]. Patient c/o vomiting X5 days. Patient reports to EMS that vomiting has been so bad she has not been able to get out of bed. Also c/o mild diffuse abdominal pain.

## 2019-02-24 ENCOUNTER — Other Ambulatory Visit: Payer: Self-pay

## 2019-02-24 DIAGNOSIS — Z79899 Other long term (current) drug therapy: Secondary | ICD-10-CM | POA: Diagnosis not present

## 2019-02-24 DIAGNOSIS — F17210 Nicotine dependence, cigarettes, uncomplicated: Secondary | ICD-10-CM | POA: Diagnosis present

## 2019-02-24 DIAGNOSIS — Z23 Encounter for immunization: Secondary | ICD-10-CM | POA: Diagnosis present

## 2019-02-24 DIAGNOSIS — R112 Nausea with vomiting, unspecified: Secondary | ICD-10-CM | POA: Diagnosis present

## 2019-02-24 DIAGNOSIS — Z86711 Personal history of pulmonary embolism: Secondary | ICD-10-CM | POA: Diagnosis not present

## 2019-02-24 DIAGNOSIS — E876 Hypokalemia: Secondary | ICD-10-CM | POA: Diagnosis present

## 2019-02-24 DIAGNOSIS — Z86718 Personal history of other venous thrombosis and embolism: Secondary | ICD-10-CM | POA: Diagnosis not present

## 2019-02-24 DIAGNOSIS — I10 Essential (primary) hypertension: Secondary | ICD-10-CM | POA: Diagnosis present

## 2019-02-24 DIAGNOSIS — Z7901 Long term (current) use of anticoagulants: Secondary | ICD-10-CM | POA: Diagnosis not present

## 2019-02-24 DIAGNOSIS — F12988 Cannabis use, unspecified with other cannabis-induced disorder: Secondary | ICD-10-CM | POA: Diagnosis present

## 2019-02-24 DIAGNOSIS — F419 Anxiety disorder, unspecified: Secondary | ICD-10-CM | POA: Diagnosis present

## 2019-02-24 DIAGNOSIS — Z7951 Long term (current) use of inhaled steroids: Secondary | ICD-10-CM | POA: Diagnosis not present

## 2019-02-24 DIAGNOSIS — N179 Acute kidney failure, unspecified: Secondary | ICD-10-CM | POA: Diagnosis present

## 2019-02-24 DIAGNOSIS — Z9049 Acquired absence of other specified parts of digestive tract: Secondary | ICD-10-CM | POA: Diagnosis not present

## 2019-02-24 DIAGNOSIS — Z20828 Contact with and (suspected) exposure to other viral communicable diseases: Secondary | ICD-10-CM | POA: Diagnosis present

## 2019-02-24 LAB — BASIC METABOLIC PANEL
Anion gap: 14 (ref 5–15)
Anion gap: 11 (ref 5–15)
BUN: 45 mg/dL — ABNORMAL HIGH (ref 6–20)
BUN: 34 mg/dL — ABNORMAL HIGH (ref 6–20)
CO2: 36 mmol/L — ABNORMAL HIGH (ref 22–32)
CO2: 30 mmol/L (ref 22–32)
Calcium: 8.7 mg/dL — ABNORMAL LOW (ref 8.9–10.3)
Calcium: 8.7 mg/dL — ABNORMAL LOW (ref 8.9–10.3)
Chloride: 84 mmol/L — ABNORMAL LOW (ref 98–111)
Chloride: 92 mmol/L — ABNORMAL LOW (ref 98–111)
Creatinine, Ser: 1.23 mg/dL — ABNORMAL HIGH (ref 0.44–1.00)
Creatinine, Ser: 1.06 mg/dL — ABNORMAL HIGH (ref 0.44–1.00)
GFR calc Af Amer: 56 mL/min — ABNORMAL LOW (ref >60)
GFR calc Af Amer: >60 mL/min (ref >60)
GFR calc non Af Amer: 48 mL/min — ABNORMAL LOW (ref >60)
GFR calc non Af Amer: 57 mL/min — ABNORMAL LOW (ref >60)
Glucose, Bld: 91 mg/dL (ref 70–99)
Glucose, Bld: 94 mg/dL (ref 70–99)
Potassium: 2.2 mmol/L — CL (ref 3.5–5.1)
Potassium: 3.7 mmol/L (ref 3.5–5.1)
Sodium: 134 mmol/L — ABNORMAL LOW (ref 135–145)
Sodium: 133 mmol/L — ABNORMAL LOW (ref 135–145)

## 2019-02-24 LAB — HEPATIC FUNCTION PANEL
ALT: 20 U/L (ref 0–44)
AST: 27 U/L (ref 15–41)
Albumin: 3.6 g/dL (ref 3.5–5.0)
Alkaline Phosphatase: 82 U/L (ref 38–126)
Bilirubin, Direct: 0.3 mg/dL — ABNORMAL HIGH (ref 0.0–0.2)
Indirect Bilirubin: 1.4 mg/dL — ABNORMAL HIGH (ref 0.3–0.9)
Total Bilirubin: 1.7 mg/dL — ABNORMAL HIGH (ref 0.3–1.2)
Total Protein: 6.9 g/dL (ref 6.5–8.1)

## 2019-02-24 LAB — CBC WITH DIFFERENTIAL/PLATELET
Abs Immature Granulocytes: 0.02 10*3/uL (ref 0.00–0.07)
Basophils Absolute: 0 10*3/uL (ref 0.0–0.1)
Basophils Relative: 0 %
Eosinophils Absolute: 0.1 10*3/uL (ref 0.0–0.5)
Eosinophils Relative: 1 %
HCT: 36.6 % (ref 36.0–46.0)
Hemoglobin: 12.4 g/dL (ref 12.0–15.0)
Immature Granulocytes: 0 %
Lymphocytes Relative: 15 %
Lymphs Abs: 1 10*3/uL (ref 0.7–4.0)
MCH: 31.7 pg (ref 26.0–34.0)
MCHC: 33.9 g/dL (ref 30.0–36.0)
MCV: 93.6 fL (ref 80.0–100.0)
Monocytes Absolute: 1 10*3/uL (ref 0.1–1.0)
Monocytes Relative: 16 %
Neutro Abs: 4.6 10*3/uL (ref 1.7–7.7)
Neutrophils Relative %: 68 %
Platelets: 222 10*3/uL (ref 150–400)
RBC: 3.91 MIL/uL (ref 3.87–5.11)
RDW: 12.6 % (ref 11.5–15.5)
WBC: 6.7 10*3/uL (ref 4.0–10.5)
nRBC: 0 % (ref 0.0–0.2)

## 2019-02-24 LAB — PROTIME-INR
INR: 1 (ref 0.8–1.2)
Prothrombin Time: 13.6 seconds (ref 11.4–15.2)

## 2019-02-24 LAB — RAPID URINE DRUG SCREEN, HOSP PERFORMED
Amphetamines: NOT DETECTED
Barbiturates: NOT DETECTED
Benzodiazepines: NOT DETECTED
Cocaine: NOT DETECTED
Opiates: NOT DETECTED
Tetrahydrocannabinol: POSITIVE — AB

## 2019-02-24 LAB — HIV ANTIBODY (ROUTINE TESTING W REFLEX): HIV Screen 4th Generation wRfx: NONREACTIVE

## 2019-02-24 LAB — HEPARIN LEVEL (UNFRACTIONATED)
Heparin Unfractionated: <0.1 IU/mL — ABNORMAL LOW (ref 0.30–0.70)
Heparin Unfractionated: 0.39 IU/mL (ref 0.30–0.70)
Heparin Unfractionated: 0.43 IU/mL (ref 0.30–0.70)

## 2019-02-24 LAB — APTT: aPTT: 22 seconds — ABNORMAL LOW (ref 24–36)

## 2019-02-24 LAB — SARS CORONAVIRUS 2 (TAT 6-24 HRS): SARS Coronavirus 2: NEGATIVE

## 2019-02-24 MED ORDER — HEPARIN (PORCINE) 25000 UT/250ML-% IV SOLN
1100.0000 [IU]/h | INTRAVENOUS | Status: DC
  Administered 2019-02-24 (×2): 1100 [IU]/h via INTRAVENOUS
  Filled 2019-02-24 (×2): qty 250

## 2019-02-24 MED ORDER — SODIUM CHLORIDE 0.9% FLUSH
10.0000 mL | Freq: Two times a day (BID) | INTRAVENOUS | Status: DC
  Administered 2019-02-24 – 2019-02-25 (×2): 10 mL

## 2019-02-24 MED ORDER — POTASSIUM CHLORIDE CRYS ER 20 MEQ PO TBCR
60.0000 meq | EXTENDED_RELEASE_TABLET | Freq: Once | ORAL | Status: AC
  Administered 2019-02-24: 60 meq via ORAL
  Filled 2019-02-24: qty 3

## 2019-02-24 MED ORDER — INFLUENZA VAC SPLIT QUAD 0.5 ML IM SUSY: 0.5000 mL | PREFILLED_SYRINGE | INTRAMUSCULAR | Status: DC

## 2019-02-24 MED ORDER — SODIUM CHLORIDE 0.9% FLUSH: 10.0000 mL | INTRAVENOUS | Status: DC | PRN

## 2019-02-24 MED ORDER — POTASSIUM CHLORIDE 10 MEQ/100ML IV SOLN
10.0000 meq | INTRAVENOUS | Status: AC
  Administered 2019-02-24 (×6): 10 meq via INTRAVENOUS
  Filled 2019-02-24 (×6): qty 100

## 2019-02-24 MED ORDER — PROMETHAZINE HCL 25 MG/ML IJ SOLN
25.0000 mg | Freq: Four times a day (QID) | INTRAMUSCULAR | Status: DC | PRN
  Administered 2019-02-24: 25 mg via INTRAVENOUS
  Filled 2019-02-24: qty 1

## 2019-02-24 MED ORDER — HEPARIN BOLUS VIA INFUSION
2000.0000 [IU] | Freq: Once | INTRAVENOUS | Status: AC
  Administered 2019-02-24: 2000 [IU] via INTRAVENOUS
  Filled 2019-02-24: qty 2000

## 2019-02-24 MED ORDER — POTASSIUM CHLORIDE CRYS ER 20 MEQ PO TBCR: 60.0000 meq | EXTENDED_RELEASE_TABLET | Freq: Two times a day (BID) | ORAL | Status: DC

## 2019-02-24 NOTE — Progress Notes (Addendum)
ANTICOAGULATION CONSULT NOTE - Initial Consult  Pharmacy Consult for IV heparin Indication: History of VTE (prescribed rivaroxaban PTA)  No Known Allergies  Patient Measurements: Height: 5\' 4"  (162.6 cm) Weight: 140 lb 9.6 oz (63.8 kg) IBW/kg (Calculated) : 54.7 Heparin Dosing Weight = total body weight = 64 kg  Vital Signs: Temp: 98.6 F (37 C) (11/30 0532) Temp Source: Oral (11/30 0532) BP: 157/80 (11/30 0542) Pulse Rate: 87 (11/30 0542)  Labs: Recent Labs    02/23/19 1808 02/24/19 0209 02/24/19 0954  HGB 15.2* 12.4  --   HCT 43.8 36.6  --   PLT 289 222  --   APTT  --  22*  --   LABPROT  --  13.6  --   INR  --  1.0  --   HEPARINUNFRC  --  <0.10* 0.39  CREATININE 1.45* 1.23*  --     Estimated Creatinine Clearance: 42.5 mL/min (A) (by C-G formula based on SCr of 1.23 mg/dL (H)).   Medical History: Past Medical History:  Diagnosis Date  . Depression   . H/O blood clots   . Hypertension   . Lupus ([place])     Medications:  Scheduled:  . [START ON 02/25/2019] influenza vac split quadrivalent PF  0.5 mL Intramuscular Tomorrow-1000  . mometasone-formoterol  2 puff Inhalation BID  . sodium chloride flush  10-40 mL Intracatheter Q12H  . umeclidinium bromide  1 puff Inhalation Daily   Infusions:  . heparin 1,100 Units/hr (02/24/19 0340)  . lactated ringers 1,000 mL with potassium chloride 40 mEq infusion 100 mL/hr at 02/24/19 0057  . potassium chloride 10 mEq (02/24/19 7622)    Assessment: Pharmacy consulted to dose/monitor heparin drip on this 6 yoF admitted with abdominal pain, N/V. Pt is prescribed rivaroxaban PTA for a history of DVTs, but has been unable to tolerate recently due to n/v.   LD xarelto last week  Baseline labs: H/H, plts WNL, aptt =22 , INR =1, HL = <0.10 undetectable  Today, 02/24/19  HL = 0.39 is therapeutic on heparin infusion of 1100 units/hr  Since baseline HL undetectable, can monitor/adjust using HL only.  Confirmed with RN  that heparin infusing at correct rate with no issues/interruptions. No signs of bleeding or bruising.   Goal of Therapy:  Heparin level 0.3-0.7 units/ml Monitor platelets by anticoagulation protocol: Yes   Plan:   Continue heparin infusion at current rate of 1100 units/hr  Check confirmatory HL in 6 hours  CBC and HL daily   Monitor for signs/symptoms of bleeding  Follow for ability to transition back to oral anticoagulation  Lenis Noon, PharmD 02/24/2019,10:41 AM

## 2019-02-24 NOTE — Progress Notes (Signed)
PROGRESS NOTE    Kathleen Gomez  WUJ:[id number] DOB: Aug 12, 1960 DOA: 02/23/2019 PCP: System, Pcp Not In   Brief Narrative:  Per HPI: Kathleen LaityLisa Gomez is a 58 y.o. female with history of lupus, hypertension, DVT/PE, depression anxiety and bronchitis presents to the ER with complaints of having persistent abdominal pain with nausea vomiting unable to keep anything.  Patient has been having abdominal pain for the last 2 weeks over the last 5 days has been having persistent vomiting but denies any diarrhea.  Unable to eat anything.  Pain is diffuse.  Denies any blood in the vomitus.  Patient states he had a similar episode 3 years ago when patient was in [place].  At that time patient had EGD and colonoscopy and as per the patient it was unremarkable.  We do not have any access to her records.  Patient just moved from [place] 2 months ago.  11/30: Patient states she is somewhat nauseous, but this is improving. [facility] Try clear liquids today. Continue potassium supplementation.  Assessment & Plan:   Principal Problem:   Nausea & vomiting Active Problems:   Hypokalemia   DVT (deep venous thrombosis) (HCC)   Anxiety   Lupus (HCC)   Migraine   1. Abdominal pain with intractable nausea vomiting likely secondary to THC. Trial of CLD today and advance as tolerated. COVID negative. 2. Severe hypokalemia likely from vomiting.  Replacement ongoing. Recheck at 1400 and in am. 3. Acute renal failure likely prerenal-improving. Continue IVF and recheck in am. 4. History of DVT/PE on Xarelto which patient is unable to take because of persistent vomiting.  We will keep patient on heparin for now and patient is agreeable. 5. History of hypertension we will keep patient on as needed IV hydralazine since patient is unable to take orally. 6. History of migraine on Topamax. 7. History of lupus presently not on medications.   DVT prophylaxis:Heparin infusion Code Status:  Full Family Communication: None at bedside Disposition Plan: Potassium supplementation and diet advancement, will anticipate dc in 1-2 days if improved   Consultants:   None  Procedures:   None  Antimicrobials:   None   Subjective: Patient seen and evaluated today with some mild nausea, but no vomiting. Denies abdominal pain. She is feeling slightly better.  Objective: Vitals:   02/24/19 0056 02/24/19 0532 02/24/19 0537 02/24/19 0542  BP: (!) 142/75 (!) 199/79 (!) 157/88 (!) 157/80  Pulse: 95 89 90 87  Resp: 16 18    Temp: 98.2 F (36.8 C) 98.6 F (37 C)    TempSrc: Oral Oral    SpO2: 100% 95%    Weight:      Height:        Intake/Output Summary (Last 24 hours) at 02/24/2019 1338 Last data filed at 02/24/2019 1203 Gross per 24 hour  Intake 896.58 ml  Output 875 ml  Net 21.58 ml   Filed Weights   02/24/19 0012  Weight: 63.8 kg    Examination:  General exam: Appears calm and comfortable  Respiratory system: Clear to auscultation. Respiratory effort normal. Cardiovascular system: S1 & S2 heard, RRR. No JVD, murmurs, rubs, gallops or clicks. No pedal edema. Gastrointestinal system: Abdomen is nondistended, soft and nontender. No organomegaly or masses felt. Normal bowel sounds heard. Central nervous system: Alert and oriented. No focal neurological deficits. Extremities: Symmetric 5 x 5 power. Skin: No rashes, lesions or ulcers Psychiatry: Judgement and insight appear normal. Mood & affect appropriate.  Data Reviewed: I have personally reviewed following labs and imaging studies  CBC: Recent Labs  Lab 02/23/19 1808 02/24/19 0209  WBC 9.1 6.7  NEUTROABS  --  4.6  HGB 15.2* 12.4  HCT 43.8 36.6  MCV 92.2 93.6  PLT 289 235   Basic Metabolic Panel: Recent Labs  Lab 02/23/19 1808 02/24/19 0209  NA 132* 134*  K <2.0* 2.2*  CL 70* 84*  CO2 39* 36*  GLUCOSE 104* 91  BUN 59* 45*  CREATININE 1.45* 1.23*  CALCIUM 9.5 8.7*  MG 2.8*  --     GFR: Estimated Creatinine Clearance: 42.5 mL/min (A) (by C-G formula based on SCr of 1.23 mg/dL (H)). Liver Function Tests: Recent Labs  Lab 02/23/19 1808 02/24/19 0209  AST 36 27  ALT 24 20  ALKPHOS 100 82  BILITOT 3.0* 1.7*  PROT 8.5* 6.9  ALBUMIN 4.6 3.6   Recent Labs  Lab 02/23/19 1808  LIPASE 26   No results for input(s): AMMONIA in the last 168 hours. Coagulation Profile: Recent Labs  Lab 02/24/19 0209  INR 1.0   Cardiac Enzymes: No results for input(s): CKTOTAL, CKMB, CKMBINDEX, TROPONINI in the last 168 hours. BNP (last 3 results) No results for input(s): PROBNP in the last 8760 hours. HbA1C: No results for input(s): HGBA1C in the last 72 hours. CBG: No results for input(s): GLUCAP in the last 168 hours. Lipid Profile: No results for input(s): CHOL, HDL, LDLCALC, TRIG, CHOLHDL, LDLDIRECT in the last 72 hours. Thyroid Function Tests: No results for input(s): TSH, T4TOTAL, FREET4, T3FREE, THYROIDAB in the last 72 hours. Anemia Panel: No results for input(s): VITAMINB12, FOLATE, FERRITIN, TIBC, IRON, RETICCTPCT in the last 72 hours. Sepsis Labs: No results for input(s): PROCALCITON, LATICACIDVEN in the last 168 hours.  Recent Results (from the past 240 hour(s))  SARS CORONAVIRUS 2 (TAT 6-24 HRS) Nasopharyngeal Nasopharyngeal Swab     Status: None   Collection Time: 02/23/19  7:14 PM   Specimen: Nasopharyngeal Swab  Result Value Ref Range Status   SARS Coronavirus 2 NEGATIVE NEGATIVE Final    Comment: (NOTE) SARS-CoV-2 target nucleic acids are NOT DETECTED. The SARS-CoV-2 RNA is generally detectable in upper and lower respiratory specimens during the acute phase of infection. Negative results do not preclude SARS-CoV-2 infection, do not rule out co-infections with other pathogens, and should not be used as the sole basis for treatment or other patient management decisions. Negative results must be combined with clinical observations, patient history,  and epidemiological information. The expected result is Negative. Fact Sheet for Patients: SugarRoll.be Fact Sheet for Healthcare Providers: https://www.woods-mathews.com/ This test is not yet approved or cleared by the [place] FDA and  has been authorized for detection and/or diagnosis of SARS-CoV-2 by FDA under an Emergency Use Authorization (EUA). This EUA will remain  in effect (meaning this test can be used) for the duration of the COVID-19 declaration under Section 56 4(b)(1) of the Act, 21 U.S.C. section 360bbb-3(b)(1), unless the authorization is terminated or revoked sooner. Performed at [facility], [place] [address] [place], [place] [address]          Radiology Studies: Ct Abdomen Pelvis W Contrast  Result Date: 02/23/2019 CLINICAL DATA:  Abdominal pain EXAM: CT ABDOMEN AND PELVIS WITH CONTRAST TECHNIQUE: Multidetector CT imaging of the abdomen and pelvis was performed using the standard protocol following bolus administration of intravenous contrast. CONTRAST:  15mL OMNIPAQUE IOHEXOL 300 MG/ML  SOLN COMPARISON:  None. FINDINGS: Lower chest: Lung bases  are clear. No effusions. Heart is normal size. Hepatobiliary: Prior cholecystectomy. Diffuse fatty infiltration of the liver. No focal hepatic abnormality. Mild intrahepatic and extrahepatic biliary prominence, likely related to post cholecystectomy state. Pancreas: No focal abnormality or ductal dilatation. Spleen: No focal abnormality.  Normal size. Adrenals/Urinary Tract: No adrenal abnormality. No focal renal abnormality. No stones or hydronephrosis. Urinary bladder is unremarkable. Stomach/Bowel: Stomach, large and small bowel grossly unremarkable. Prior appendectomy. Vascular/Lymphatic: Aortic atherosclerosis. No enlarged abdominal or pelvic lymph nodes. IVC filter in place. Reproductive: Uterus and adnexa unremarkable.  No mass. Other: No free fluid or free air.  Musculoskeletal: No acute bony abnormality. IMPRESSION: Fatty infiltration of the liver. Aortic atherosclerosis. No acute findings. Electronically Signed   By: Charlett Nose M.D.   On: 02/23/2019 21:39        Scheduled Meds:  [START ON 02/25/2019] influenza vac split quadrivalent PF  0.5 mL Intramuscular Tomorrow-1000   mometasone-formoterol  2 puff Inhalation BID   sodium chloride flush  10-40 mL Intracatheter Q12H   umeclidinium bromide  1 puff Inhalation Daily   Continuous Infusions:  heparin 1,100 Units/hr (02/24/19 0340)   lactated ringers 1,000 mL with potassium chloride 40 mEq infusion 100 mL/hr at 02/24/19 1310   potassium chloride 10 mEq (02/24/19 1310)     LOS: 0 days    Time spent: 30 minutes    Corey Laski Hoover Brunette, DO [facility] Pager [phone]  If 7PM-7AM, please contact night-coverage [email or web address] Password [email or web address] 02/24/2019, 1:38 PM

## 2019-02-24 NOTE — Progress Notes (Signed)
Midline placed, blood drawn for baselin APTT and INR.  Waiting for labs to result and heparin gtt and bolus to be validated in the [facility] by pharmacy. Educated pt on reason for heparin drip.  Pt expresses understanding, all questions answered at this time

## 2019-02-24 NOTE — Plan of Care (Signed)
  Problem: Clinical Measurements: Goal: Ability to maintain clinical measurements within normal limits will improve Outcome: Progressing Goal: Will remain free from infection Outcome: Progressing Goal: Diagnostic test results will improve Outcome: Progressing Goal: Respiratory complications will improve Outcome: Progressing Goal: Cardiovascular complication will be avoided Outcome: Progressing   Problem: Activity: Goal: Risk for activity intolerance will decrease Outcome: Progressing   Problem: Nutrition: Goal: Adequate nutrition will be maintained Outcome: Progressing   Problem: Coping: Goal: Level of anxiety will decrease Outcome: Progressing   Problem: Elimination: Goal: Will not experience complications related to bowel motility Outcome: Progressing   Problem: Pain Managment: Goal: General experience of comfort will improve Outcome: Progressing   Problem: Safety: Goal: Ability to remain free from injury will improve Outcome: Progressing

## 2019-02-24 NOTE — Plan of Care (Signed)
Initiated care plan 

## 2019-02-24 NOTE — Progress Notes (Signed)
Called the lab to check the status of the STAT labs that were sent @ approximately 0217,   Per lab tech the samples were just received, waiting to be process and resulted.  Heparin gtt not started, not validated by the pharmacist

## 2019-02-24 NOTE — Progress Notes (Signed)
CRITICAL VALUE ALERT  Critical Value:  2.2 Potassium   Date & Time Notied: 02/24/2019 0353  Provider Notified: Dr Hal Hope  Orders Received/Actions taken: Verbal order for PO 60 meq potassium

## 2019-02-24 NOTE — Progress Notes (Signed)
Brief Pharmacy Note:   38 y/oF on IV heparin while rivaroxaban for history of VTE on hold due to n/v.      Heparin level = 0.43 units/mL, remains therapeutic   CBC WNL  No bleeding or infusion issues noted per nursing   Plan:  Continue heparin infusion at 1100 units/hr  Daily CBC, heparin level  Monitor closely for s/sx of bleeding   Lindell Spar, PharmD, BCPS Clinical Pharmacist  02/24/2019 6:39 PM

## 2019-02-25 LAB — CBC
HCT: 33.2 % — ABNORMAL LOW (ref 36.0–46.0)
Hemoglobin: 11.3 g/dL — ABNORMAL LOW (ref 12.0–15.0)
MCH: 31.9 pg (ref 26.0–34.0)
MCHC: 34 g/dL (ref 30.0–36.0)
MCV: 93.8 fL (ref 80.0–100.0)
Platelets: 187 10*3/uL (ref 150–400)
RBC: 3.54 MIL/uL — ABNORMAL LOW (ref 3.87–5.11)
RDW: 12.4 % (ref 11.5–15.5)
WBC: 4.4 10*3/uL (ref 4.0–10.5)
nRBC: 0 % (ref 0.0–0.2)

## 2019-02-25 LAB — BASIC METABOLIC PANEL
Anion gap: 9 (ref 5–15)
BUN: 23 mg/dL — ABNORMAL HIGH (ref 6–20)
CO2: 28 mmol/L (ref 22–32)
Calcium: 8.8 mg/dL — ABNORMAL LOW (ref 8.9–10.3)
Chloride: 96 mmol/L — ABNORMAL LOW (ref 98–111)
Creatinine, Ser: 0.91 mg/dL (ref 0.44–1.00)
GFR calc Af Amer: >60 mL/min (ref >60)
GFR calc non Af Amer: >60 mL/min (ref >60)
Glucose, Bld: 89 mg/dL (ref 70–99)
Potassium: 3.7 mmol/L (ref 3.5–5.1)
Sodium: 133 mmol/L — ABNORMAL LOW (ref 135–145)

## 2019-02-25 LAB — MAGNESIUM: Magnesium: 2.1 mg/dL (ref 1.7–2.4)

## 2019-02-25 LAB — HEPARIN LEVEL (UNFRACTIONATED): Heparin Unfractionated: 0.46 IU/mL (ref 0.30–0.70)

## 2019-02-25 MED ORDER — GABAPENTIN 300 MG PO CAPS
600.0000 mg | ORAL_CAPSULE | Freq: Three times a day (TID) | ORAL | Status: DC
  Administered 2019-02-25: 600 mg via ORAL
  Filled 2019-02-25: qty 2

## 2019-02-25 MED ORDER — POTASSIUM CHLORIDE ER 10 MEQ PO CPCR: 10.0000 meq | ORAL_CAPSULE | Freq: Every day | ORAL | 0 refills | Status: AC

## 2019-02-25 MED ORDER — CLONIDINE HCL 0.1 MG PO TABS: 0.1000 mg | ORAL_TABLET | Freq: Two times a day (BID) | ORAL | 0 refills | Status: DC

## 2019-02-25 MED ORDER — SPIRIVA HANDIHALER 18 MCG IN CAPS: 18.0000 ug | ORAL_CAPSULE | Freq: Every day | RESPIRATORY_TRACT | 12 refills | Status: DC

## 2019-02-25 MED ORDER — ESCITALOPRAM OXALATE 10 MG PO TABS: 10.0000 mg | ORAL_TABLET | Freq: Every day | ORAL | 0 refills | Status: AC

## 2019-02-25 MED ORDER — RIVAROXABAN 20 MG PO TABS
20.0000 mg | ORAL_TABLET | Freq: Every day | ORAL | Status: DC
  Administered 2019-02-25: 20 mg via ORAL
  Filled 2019-02-25: qty 1

## 2019-02-25 MED ORDER — CLONIDINE HCL 0.1 MG PO TABS
0.1000 mg | ORAL_TABLET | Freq: Two times a day (BID) | ORAL | Status: DC
  Administered 2019-02-25: 0.1 mg via ORAL
  Filled 2019-02-25: qty 1

## 2019-02-25 MED ORDER — SPIRIVA HANDIHALER 18 MCG IN CAPS: 18.0000 ug | ORAL_CAPSULE | Freq: Every day | RESPIRATORY_TRACT | 12 refills | Status: AC

## 2019-02-25 MED ORDER — MIRTAZAPINE 45 MG PO TABS: 45.0000 mg | ORAL_TABLET | Freq: Every day | ORAL | 0 refills | Status: AC

## 2019-02-25 MED ORDER — ONDANSETRON HCL 4 MG PO TABS: 4.0000 mg | ORAL_TABLET | Freq: Every day | ORAL | 1 refills | Status: DC | PRN

## 2019-02-25 MED ORDER — POTASSIUM CHLORIDE ER 10 MEQ PO CPCR: 10.0000 meq | ORAL_CAPSULE | Freq: Every day | ORAL | 0 refills | Status: DC

## 2019-02-25 MED ORDER — ESCITALOPRAM OXALATE 10 MG PO TABS: 10.0000 mg | ORAL_TABLET | Freq: Every day | ORAL | 0 refills | Status: DC

## 2019-02-25 MED ORDER — GABAPENTIN 600 MG PO TABS: 600.0000 mg | ORAL_TABLET | Freq: Three times a day (TID) | ORAL | 0 refills | Status: AC

## 2019-02-25 MED ORDER — TOPIRAMATE 25 MG PO TABS: 25.0000 mg | ORAL_TABLET | Freq: Every day | ORAL | 0 refills | Status: DC

## 2019-02-25 MED ORDER — MIRTAZAPINE 45 MG PO TABS: 45.0000 mg | ORAL_TABLET | Freq: Every day | ORAL | 0 refills | Status: DC

## 2019-02-25 MED ORDER — PRAVASTATIN SODIUM 10 MG PO TABS: 10.0000 mg | ORAL_TABLET | Freq: Every day | ORAL | 0 refills | Status: DC

## 2019-02-25 MED ORDER — ALBUTEROL SULFATE HFA 108 (90 BASE) MCG/ACT IN AERS: 1.0000 | INHALATION_SPRAY | Freq: Four times a day (QID) | RESPIRATORY_TRACT | 1 refills | Status: AC | PRN

## 2019-02-25 MED ORDER — GABAPENTIN 600 MG PO TABS: 600.0000 mg | ORAL_TABLET | Freq: Three times a day (TID) | ORAL | 0 refills | Status: DC

## 2019-02-25 MED ORDER — PRAVASTATIN SODIUM 10 MG PO TABS: 10.0000 mg | ORAL_TABLET | Freq: Every day | ORAL | 0 refills | Status: AC

## 2019-02-25 MED ORDER — ALBUTEROL SULFATE HFA 108 (90 BASE) MCG/ACT IN AERS: 1.0000 | INHALATION_SPRAY | Freq: Four times a day (QID) | RESPIRATORY_TRACT | 1 refills | Status: DC | PRN

## 2019-02-25 MED ORDER — SYMBICORT 80-4.5 MCG/ACT IN AERO: 1.0000 | INHALATION_SPRAY | Freq: Two times a day (BID) | RESPIRATORY_TRACT | 12 refills | Status: DC

## 2019-02-25 MED ORDER — TOPIRAMATE 25 MG PO TABS: 25.0000 mg | ORAL_TABLET | Freq: Every day | ORAL | 0 refills | Status: AC

## 2019-02-25 MED ORDER — XARELTO 20 MG PO TABS: 20.0000 mg | ORAL_TABLET | Freq: Every day | ORAL | 2 refills | Status: DC

## 2019-02-25 MED ORDER — SYMBICORT 80-4.5 MCG/ACT IN AERO: 1.0000 | INHALATION_SPRAY | Freq: Two times a day (BID) | RESPIRATORY_TRACT | 12 refills | Status: AC

## 2019-02-25 MED ORDER — INFLUENZA VAC SPLIT QUAD 0.5 ML IM SUSY: 0.5000 mL | PREFILLED_SYRINGE | INTRAMUSCULAR | Status: DC | PRN

## 2019-02-25 MED ORDER — XARELTO 20 MG PO TABS: 20.0000 mg | ORAL_TABLET | Freq: Every day | ORAL | 2 refills | Status: AC

## 2019-02-25 MED ORDER — TOPIRAMATE 25 MG PO TABS: 25.0000 mg | ORAL_TABLET | Freq: Every day | ORAL | Status: DC

## 2019-02-25 MED ORDER — MIRTAZAPINE 15 MG PO TABS: 45.0000 mg | ORAL_TABLET | Freq: Every day | ORAL | Status: DC

## 2019-02-25 MED ORDER — ESCITALOPRAM OXALATE 10 MG PO TABS
10.0000 mg | ORAL_TABLET | Freq: Every day | ORAL | Status: DC
  Administered 2019-02-25: 12:00:00 10 mg via ORAL
  Filled 2019-02-25: qty 1

## 2019-02-25 MED ORDER — ALPRAZOLAM 0.5 MG PO TABS: 0.5000 mg | ORAL_TABLET | Freq: Two times a day (BID) | ORAL | Status: DC | PRN

## 2019-02-25 MED ORDER — INFLUENZA VAC SPLIT QUAD 0.5 ML IM SUSY
0.5000 mL | PREFILLED_SYRINGE | Freq: Once | INTRAMUSCULAR | Status: AC
  Administered 2019-02-25: 0.5 mL via INTRAMUSCULAR
  Filled 2019-02-25: qty 0.5

## 2019-02-25 MED ORDER — PRAVASTATIN SODIUM 20 MG PO TABS: 10.0000 mg | ORAL_TABLET | Freq: Every day | ORAL | Status: DC

## 2019-02-25 NOTE — Progress Notes (Signed)
ANTICOAGULATION CONSULT NOTE - Initial Consult  Pharmacy Consult for IV heparin Indication: History of VTE (prescribed rivaroxaban PTA)  No Known Allergies  Patient Measurements: Height: 5\' 4"  (162.6 cm) Weight: 139 lb 1.8 oz (63.1 kg) IBW/kg (Calculated) : 54.7 Heparin Dosing Weight = total body weight = 63 kg  Vital Signs: Temp: 98.8 F (37.1 C) (12/01 0607) Temp Source: Oral (12/01 0607) BP: 168/85 (12/01 0607) Pulse Rate: 75 (12/01 0607)  Labs: Recent Labs    02/23/19 1808  02/24/19 0209 02/24/19 0954 02/24/19 1503 02/24/19 1525 02/25/19 0440  HGB 15.2*  --  12.4  --   --   --  11.3*  HCT 43.8  --  36.6  --   --   --  33.2*  PLT 289  --  222  --   --   --  187  APTT  --   --  22*  --   --   --   --   LABPROT  --   --  13.6  --   --   --   --   INR  --   --  1.0  --   --   --   --   HEPARINUNFRC  --    < > <0.10* 0.39  --  0.43 0.46  CREATININE 1.45*  --  1.23*  --  1.06*  --  0.91   < > = values in this interval not displayed.    Estimated Creatinine Clearance: 57.5 mL/min (by C-G formula based on SCr of 0.91 mg/dL).   Medical History: Past Medical History:  Diagnosis Date  . Depression   . H/O blood clots   . Hypertension   . Lupus ([place])     Medications:  Scheduled:  . mometasone-formoterol  2 puff Inhalation BID  . sodium chloride flush  10-40 mL Intracatheter Q12H  . umeclidinium bromide  1 puff Inhalation Daily   Infusions:  . heparin 1,100 Units/hr (02/24/19 2335)    Assessment: Pharmacy consulted to dose/monitor heparin drip on this 40 yoF admitted with abdominal pain, N/V. Pt is prescribed rivaroxaban PTA for a history of DVTs, but has been unable to tolerate recently due to n/v.   LD xarelto last week  Baseline labs: H/H, plts WNL, aptt =22 , INR =1, HL = <0.10 undetectable  Today, 02/25/19  HL = 0.46 is therapeutic on heparin infusion of 1100 units/hr  Since baseline HL was undetectable, will monitor/adjust using HL only.  Hgb  11.3 - slightly low, slightly decreased  Plt 187 - WNL  Confirmed with RN that heparin infusing at correct rate with no issues/interruptions. No signs of bleeding or bruising.   Goal of Therapy:  Heparin level 0.3-0.7 units/ml Monitor platelets by anticoagulation protocol: Yes   Plan:   Continue heparin infusion at current rate of 1100 units/hr  CBC and HL daily   Monitor for signs/symptoms of bleeding  Follow for ability to transition back to oral anticoagulation  Lenis Noon, PharmD 02/25/2019,8:10 AM

## 2019-02-25 NOTE — Plan of Care (Signed)
  Problem: Clinical Measurements: Goal: Ability to maintain clinical measurements within normal limits will improve Outcome: Adequate for Discharge Goal: Will remain free from infection Outcome: Adequate for Discharge Goal: Diagnostic test results will improve Outcome: Adequate for Discharge Goal: Respiratory complications will improve Outcome: Adequate for Discharge Goal: Cardiovascular complication will be avoided Outcome: Adequate for Discharge   Problem: Activity: Goal: Risk for activity intolerance will decrease Outcome: Adequate for Discharge   Problem: Nutrition: Goal: Adequate nutrition will be maintained Outcome: Adequate for Discharge   Problem: Coping: Goal: Level of anxiety will decrease Outcome: Adequate for Discharge   Problem: Elimination: Goal: Will not experience complications related to bowel motility Outcome: Adequate for Discharge   Problem: Pain Managment: Goal: General experience of comfort will improve Outcome: Adequate for Discharge   Problem: Safety: Goal: Ability to remain free from injury will improve Outcome: Adequate for Discharge

## 2019-02-25 NOTE — Discharge Summary (Signed)
Physician Discharge Summary  Kathleen Gomez ZOX:[id number] DOB: 02/02/1961 DOA: 02/23/2019  PCP: System, Pcp Not In  Admit date: 02/23/2019  Discharge date: 02/25/2019  Admitted From:Home  Disposition:  Home  Recommendations for Outpatient Follow-up:  1. Follow up with PCP in 1-2 weeks from list that has been given 2. Please take Zofran as needed for any nausea or vomiting as prescribed with refills given on other medications until follow-up with PCP occurs 3. Counseled on discontinuing THC use.  Home Health: None  Equipment/Devices: None  Discharge Condition: Stable  CODE STATUS: Full  Diet recommendation: Heart Healthy  Brief/Interim Summary: Per HPI: Kathleen Gomez a 58 y.o.femalewithhistory of lupus, hypertension, DVT/PE, depression anxiety and bronchitis presents to the ER with complaints of having persistent abdominal pain with nausea vomiting unable to keep anything. Patient has been having abdominal pain for the last 2 weeks over the last 5 days has been having persistent vomiting but denies any diarrhea. Unable to eat anything. Pain is diffuse. Denies any blood in the vomitus. Patient states he had a similar episode 3 years ago when patient was in [place]. At that time patient had EGD and colonoscopy and as per the patient it was unremarkable.  We do not have any access to her records. Patient just moved from [place] 2 months ago.  11/30: Patient states she is somewhat nauseous, but this is improving. Appe[facility] to be [facility]. Try clear liquids today. Continue potassium supplementation.  12/1: Patient is no longer nauseous and has not required any further antiemetics overnight.  She is tolerating her clear liquid diet and has been able to have her lunch without any issues or concerns.  She remains somewhat mildly confused, but it is uncertain whether or not this is a chronic issue.  She is stable for discharge today and will be given some Zofran as  needed for any further symptoms.  She has been given refills on the majority of the rest of her medications with a list of PCP to follow-up with in the near future.  She has been counseled on cessation of marijuana use as this was likely the cause of her persistent nausea and vomiting.  Discharge Diagnoses:  Principal Problem:   Nausea & vomiting Active Problems:   Hypokalemia   DVT (deep venous thrombosis) (HCC)   Anxiety   Lupus (HCC)   Migraine   Intractable nausea and vomiting  Principal discharge diagnosis: Intractable nausea and vomiting secondary to cannabis hyperemesis.  Discharge Instructions  Discharge Instructions    Diet - low sodium heart healthy   Complete by: As directed    Diet - low sodium heart healthy   Complete by: As directed    Increase activity slowly   Complete by: As directed    Increase activity slowly   Complete by: As directed      Allergies as of 02/25/2019   No Known Allergies     Medication List    TAKE these medications   albuterol 108 (90 Base) MCG/ACT inhaler Commonly known as: VENTOLIN HFA Inhale 1 puff into the lungs every 6 (six) hours as needed for wheezing or shortness of breath.   ALPRAZolam 0.5 MG tablet Commonly known as: XANAX Take 0.5 mg by mouth 2 (two) times daily as needed for anxiety or sleep.   cloNIDine 0.1 MG tablet Commonly known as: CATAPRES Take 1 tablet (0.1 mg total) by mouth 2 (two) times daily.   escitalopram 10 MG tablet Commonly known as: LEXAPRO  Take 1 tablet (10 mg total) by mouth daily.   gabapentin 600 MG tablet Commonly known as: NEURONTIN Take 1 tablet (600 mg total) by mouth 3 (three) times daily.   mirtazapine 45 MG tablet Commonly known as: REMERON Take 1 tablet (45 mg total) by mouth at bedtime.   ondansetron 4 MG tablet Commonly known as: Zofran Take 1 tablet (4 mg total) by mouth daily as needed for nausea or vomiting.   potassium chloride 10 MEQ CR capsule Commonly known as:  MICRO-K Take 1 capsule (10 mEq total) by mouth daily.   pravastatin 10 MG tablet Commonly known as: PRAVACHOL Take 1 tablet (10 mg total) by mouth at bedtime.   Spiriva HandiHaler 18 MCG inhalation capsule Generic drug: tiotropium Place 1 capsule (18 mcg total) into inhaler and inhale daily. What changed: how much to take   Symbicort 80-4.5 MCG/ACT inhaler Generic drug: budesonide-formoterol Inhale 1 puff into the lungs 2 (two) times daily.   topiramate 25 MG tablet Commonly known as: TOPAMAX Take 1 tablet (25 mg total) by mouth at bedtime.   Xarelto 20 MG Tabs tablet Generic drug: rivaroxaban Take 1 tablet (20 mg total) by mouth daily.      Follow-up Information    pcp Follow up in 1 week(s).          No Known Allergies  Consultations:  None   Procedures/Studies: Ct Abdomen Pelvis W Contrast  Result Date: 02/23/2019 CLINICAL DATA:  Abdominal pain EXAM: CT ABDOMEN AND PELVIS WITH CONTRAST TECHNIQUE: Multidetector CT imaging of the abdomen and pelvis was performed using the standard protocol following bolus administration of intravenous contrast. CONTRAST:  75mL OMNIPAQUE IOHEXOL 300 MG/ML  SOLN COMPARISON:  None. FINDINGS: Lower chest: Lung bases are clear. No effusions. Heart is normal size. Hepatobiliary: Prior cholecystectomy. Diffuse fatty infiltration of the liver. No focal hepatic abnormality. Mild intrahepatic and extrahepatic biliary prominence, likely related to post cholecystectomy state. Pancreas: No focal abnormality or ductal dilatation. Spleen: No focal abnormality.  Normal size. Adrenals/Urinary Tract: No adrenal abnormality. No focal renal abnormality. No stones or hydronephrosis. Urinary bladder is unremarkable. Stomach/Bowel: Stomach, large and small bowel grossly unremarkable. Prior appendectomy. Vascular/Lymphatic: Aortic atherosclerosis. No enlarged abdominal or pelvic lymph nodes. IVC filter in place. Reproductive: Uterus and adnexa unremarkable.  No  mass. Other: No free fluid or free air. Musculoskeletal: No acute bony abnormality. IMPRESSION: Fatty infiltration of the liver. Aortic atherosclerosis. No acute findings. Electronically Signed   By: Charlett Nose M.D.   On: 02/23/2019 21:39     Discharge Exam: Vitals:   02/25/19 0607 02/25/19 0839  BP: (!) 168/85   Pulse: 75   Resp: 18   Temp: 98.8 F (37.1 C)   SpO2: 99% 95%   Vitals:   02/24/19 0542 02/24/19 2103 02/25/19 0607 02/25/19 0839  BP: (!) 157/80 (!) 163/81 (!) 168/85   Pulse: 87 75 75   Resp:  18 18   Temp:  98.7 F (37.1 C) 98.8 F (37.1 C)   TempSrc:  Oral Oral   SpO2:  100% 99% 95%  Weight:   63.1 kg   Height:        General: Pt is alert, awake, not in acute distress Cardiovascular: RRR, S1/S2 +, no rubs, no gallops Respiratory: CTA bilaterally, no wheezing, no rhonchi Abdominal: Soft, NT, ND, bowel sounds + Extremities: no edema, no cyanosis    The results of significant diagnostics from this hospitalization (including imaging, microbiology, ancillary and laboratory) are listed below for  reference.     Microbiology: Recent Results (from the past 240 hour(s))  SARS CORONAVIRUS 2 (TAT 6-24 HRS) Nasopharyngeal Nasopharyngeal Swab     Status: None   Collection Time: 02/23/19  7:14 PM   Specimen: Nasopharyngeal Swab  Result Value Ref Range Status   SARS Coronavirus 2 NEGATIVE NEGATIVE Final    Comment: (NOTE) SARS-CoV-2 target nucleic acids are NOT DETECTED. The SARS-CoV-2 RNA is generally detectable in upper and lower respiratory specimens during the acute phase of infection. Negative results do not preclude SARS-CoV-2 infection, do not rule out co-infections with other pathogens, and should not be used as the sole basis for treatment or other patient management decisions. Negative results must be combined with clinical observations, patient history, and epidemiological information. The expected result is Negative. Fact Sheet for  Patients: SugarRoll.be Fact Sheet for Healthcare Providers: https://www.woods-mathews.com/ This test is not yet approved or cleared by the [place] FDA and  has been authorized for detection and/or diagnosis of SARS-CoV-2 by FDA under an Emergency Use Authorization (EUA). This EUA will remain  in effect (meaning this test can be used) for the duration of the COVID-19 declaration under Section 56 4(b)(1) of the Act, 21 U.S.C. section 360bbb-3(b)(1), unless the authorization is terminated or revoked sooner. Performed at [facility], [place] [address] [place], [place] [address]      Labs: BNP (last 3 results) No results for input(s): BNP in the last 8760 hours. Basic Metabolic Panel: Recent Labs  Lab 02/23/19 1808 02/24/19 0209 02/24/19 1503 02/25/19 0440  NA 132* 134* 133* 133*  K <2.0* 2.2* 3.7 3.7  CL 70* 84* 92* 96*  CO2 39* 36* 30 28  GLUCOSE 104* 91 94 89  BUN 59* 45* 34* 23*  CREATININE 1.45* 1.23* 1.06* 0.91  CALCIUM 9.5 8.7* 8.7* 8.8*  MG 2.8*  --   --  2.1   Liver Function Tests: Recent Labs  Lab 02/23/19 1808 02/24/19 0209  AST 36 27  ALT 24 20  ALKPHOS 100 82  BILITOT 3.0* 1.7*  PROT 8.5* 6.9  ALBUMIN 4.6 3.6   Recent Labs  Lab 02/23/19 1808  LIPASE 26   No results for input(s): AMMONIA in the last 168 hours. CBC: Recent Labs  Lab 02/23/19 1808 02/24/19 0209 02/25/19 0440  WBC 9.1 6.7 4.4  NEUTROABS  --  4.6  --   HGB 15.2* 12.4 11.3*  HCT 43.8 36.6 33.2*  MCV 92.2 93.6 93.8  PLT 289 222 187   Cardiac Enzymes: No results for input(s): CKTOTAL, CKMB, CKMBINDEX, TROPONINI in the last 168 hours. BNP: Invalid input(s): POCBNP CBG: No results for input(s): GLUCAP in the last 168 hours. D-Dimer No results for input(s): DDIMER in the last 72 hours. Hgb A1c No results for input(s): HGBA1C in the last 72 hours. Lipid Profile No results for input(s): CHOL, HDL, LDLCALC, TRIG, CHOLHDL,  LDLDIRECT in the last 72 hours. Thyroid function studies No results for input(s): TSH, T4TOTAL, T3FREE, THYROIDAB in the last 72 hours.  Invalid input(s): FREET3 Anemia work up No results for input(s): VITAMINB12, FOLATE, FERRITIN, TIBC, IRON, RETICCTPCT in the last 72 hours. Urinalysis    Component Value Date/Time   COLORURINE YELLOW 02/23/2019 1808   APPEARANCEUR CLEAR 02/23/2019 1808   LABSPEC 1.016 02/23/2019 1808   PHURINE 6.0 02/23/2019 1808   GLUCOSEU NEGATIVE 02/23/2019 1808   HGBUR MODERATE (A) 02/23/2019 1808   BILIRUBINUR NEGATIVE 02/23/2019 1808   KETONESUR 80 (A) 02/23/2019 1808   PROTEINUR  30 (A) 02/23/2019 1808   NITRITE NEGATIVE 02/23/2019 1808   LEUKOCYTESUR NEGATIVE 02/23/2019 1808   Sepsis Labs Invalid input(s): PROCALCITONIN,  WBC,  LACTICIDVEN Microbiology Recent Results (from the past 240 hour(s))  SARS CORONAVIRUS 2 (TAT 6-24 HRS) Nasopharyngeal Nasopharyngeal Swab     Status: None   Collection Time: 02/23/19  7:14 PM   Specimen: Nasopharyngeal Swab  Result Value Ref Range Status   SARS Coronavirus 2 NEGATIVE NEGATIVE Final    Comment: (NOTE) SARS-CoV-2 target nucleic acids are NOT DETECTED. The SARS-CoV-2 RNA is generally detectable in upper and lower respiratory specimens during the acute phase of infection. Negative results do not preclude SARS-CoV-2 infection, do not rule out co-infections with other pathogens, and should not be used as the sole basis for treatment or other patient management decisions. Negative results must be combined with clinical observations, patient history, and epidemiological information. The expected result is Negative. Fact Sheet for Patients: HairSlick.no Fact Sheet for Healthcare Providers: quierodirigir.com This test is not yet approved or cleared by the [place] FDA and  has been authorized for detection and/or diagnosis of SARS-CoV-2 by FDA under an  Emergency Use Authorization (EUA). This EUA will remain  in effect (meaning this test can be used) for the duration of the COVID-19 declaration under Section 56 4(b)(1) of the Act, 21 U.S.C. section 360bbb-3(b)(1), unless the authorization is terminated or revoked sooner. Performed at [facility], [address] [place], [place] [address]      Time coordinating discharge: 35 minutes  SIGNED:   Erick Blinks, DO [facility] 02/25/2019, 11:17 AM  If 7PM-7AM, please contact night-coverage [email or web address]

## 2019-02-25 NOTE — TOC Progression Note (Signed)
Transition of Care [facility]) - Progression Note    Patient Details  Name: Kathleen Gomez MRN: [id number] Date of Birth: November 30, 1960  [facility]) CM/SW Contact  Purcell Mouton, RN Phone Number: 02/25/2019, 12:50 PM  Clinical Narrative:     PCP list given to pt's RN for pt to call for an appointment.        Expected Discharge Plan and Services           Expected Discharge Date: 02/25/19                                     Social Determinants of Health (SDOH) Interventions    Readmission Risk Interventions No flowsheet data found.

## 2019-03-02 ENCOUNTER — Ambulatory Visit (HOSPITAL_COMMUNITY)
Admission: EM | Admit: 2019-03-02 | Discharge: 2019-03-02 | Disposition: A | Discharge status: Home / Self Care | Payer: Medicare (Managed Care) | Attending: Family Medicine | Admitting: Family Medicine

## 2019-03-02 ENCOUNTER — Encounter (HOSPITAL_COMMUNITY): Payer: Self-pay

## 2019-03-02 ENCOUNTER — Other Ambulatory Visit: Payer: Self-pay

## 2019-03-02 DIAGNOSIS — R110 Nausea: Secondary | ICD-10-CM | POA: Diagnosis not present

## 2019-03-02 MED ORDER — PROMETHAZINE HCL 25 MG RE SUPP: 25.0000 mg | Freq: Three times a day (TID) | RECTAL | 1 refills | Status: AC | PRN

## 2019-03-02 MED ORDER — ONDANSETRON 4 MG PO TBDP
ORAL_TABLET | ORAL | Status: AC
  Filled 2019-03-02: qty 1

## 2019-03-02 MED ORDER — ONDANSETRON 4 MG PO TBDP
4.0000 mg | ORAL_TABLET | Freq: Once | ORAL | Status: AC
  Administered 2019-03-02: 4 mg via ORAL

## 2019-03-02 NOTE — ED Triage Notes (Signed)
Patient presents to Urgent Care with complaints of nausea and vomiting since this morning. Patient reports she was just discharged from the hospital 4 days ago for low potassium (2.0) and wanted to get ahead of the problem before she had to be admitted again, pt has not been able to eat due to the vomiting. Pt thinks she was given phenergan for the nausea to take at home.

## 2019-03-02 NOTE — ED Provider Notes (Signed)
[facility]    CSN: [id number] Arrival date & time: 03/02/19  1233      History   Chief Complaint Chief Complaint  Patient presents with  . Nausea    HPI Kathleen Gomez is a 58 y.o. female.   HPI   Patient is here for nausea and vomiting.  She was just discharged from the hospital on 02/25/2019 for an episode of protracted vomiting that resulted in hypokalemia.  She was sent home with Zofran.  When she started with nausea and vomiting this morning she took Zofran.  It has not worked, she persists in feeling terribly nauseated.  Past Medical History:  Diagnosis Date  . Depression   . H/O blood clots   . Hypertension   . Lupus [facility])     Patient Active Problem List   Diagnosis Date Noted  . Intractable nausea and vomiting 02/24/2019  . Nausea & vomiting 02/23/2019  . Hypokalemia 02/23/2019  . DVT (deep venous thrombosis) ([place]) 02/23/2019  . Anxiety 02/23/2019  . Lupus ([place]) 02/23/2019  . Migraine 02/23/2019    Past Surgical History:  Procedure Laterality Date  . APPENDECTOMY    . CHOLECYSTECTOMY      OB History   No obstetric history on file.      Home Medications    Prior to Admission medications   Medication Sig Start Date End Date Taking? Authorizing Provider  albuterol (VENTOLIN HFA) 108 (90 Base) MCG/ACT inhaler Inhale 1 puff into the lungs every 6 (six) hours as needed for wheezing or shortness of breath. 02/25/19   Manuella Ghazi, Pratik D, DO  ALPRAZolam Duanne Moron) 0.5 MG tablet Take 0.5 mg by mouth 2 (two) times daily as needed for anxiety or sleep.  02/01/19   [provider]  cloNIDine (CATAPRES) 0.1 MG tablet Take 1 tablet (0.1 mg total) by mouth 2 (two) times daily. 02/25/19 03/27/19  Manuella Ghazi, Pratik D, DO  escitalopram (LEXAPRO) 10 MG tablet Take 1 tablet (10 mg total) by mouth daily. 02/25/19 03/27/19  Manuella Ghazi, Pratik D, DO  gabapentin (NEURONTIN) 600 MG tablet Take 1 tablet (600 mg total) by mouth 3 (three) times daily. 02/25/19 03/27/19  Manuella Ghazi,  Pratik D, DO  mirtazapine (REMERON) 45 MG tablet Take 1 tablet (45 mg total) by mouth at bedtime. 02/25/19 03/27/19  Manuella Ghazi, Pratik D, DO  potassium chloride (MICRO-K) 10 MEQ CR capsule Take 1 capsule (10 mEq total) by mouth daily. 02/25/19 03/27/19  Manuella Ghazi, Pratik D, DO  pravastatin (PRAVACHOL) 10 MG tablet Take 1 tablet (10 mg total) by mouth at bedtime. 02/25/19 03/27/19  Manuella Ghazi, Pratik D, DO  promethazine (PHENERGAN) 25 MG suppository Place 1 suppository (25 mg total) rectally every 8 (eight) hours as needed for nausea or vomiting. 03/02/19   Raylene Everts, MD  SPIRIVA HANDIHALER 18 MCG inhalation capsule Place 1 capsule (18 mcg total) into inhaler and inhale daily. 02/25/19   Manuella Ghazi, Pratik D, DO  SYMBICORT 80-4.5 MCG/ACT inhaler Inhale 1 puff into the lungs 2 (two) times daily. 02/25/19   Manuella Ghazi, Pratik D, DO  topiramate (TOPAMAX) 25 MG tablet Take 1 tablet (25 mg total) by mouth at bedtime. 02/25/19 03/27/19  Manuella Ghazi, Pratik D, DO  XARELTO 20 MG TABS tablet Take 1 tablet (20 mg total) by mouth daily. 02/25/19 03/27/19  Heath Lark D, DO    Family History Family History  Problem Relation Age of Onset  . Diabetes Mellitus II Father   . Cancer Father   . Cancer Mother  Social History Social History   Tobacco Use  . Smoking status: Current Every Day Smoker    Packs/day: 1.00    Types: Cigarettes  . Smokeless tobacco: Never Used  Substance Use Topics  . Alcohol use: Not Currently  . Drug use: Yes    Types: Marijuana     Allergies   Patient has no known allergies.   Review of Systems Review of Systems  Constitutional: Negative for chills and fever.  HENT: Negative for ear pain and sore throat.   Eyes: Negative for pain and visual disturbance.  Respiratory: Negative for cough and shortness of breath.   Cardiovascular: Negative for chest pain and palpitations.  Gastrointestinal: Positive for nausea. Negative for abdominal pain and vomiting.  Genitourinary: Negative for dysuria and  hematuria.  Musculoskeletal: Negative for arthralgias and back pain.  Skin: Negative for color change and rash.  Neurological: Negative for seizures and syncope.  All other systems reviewed and are negative.    Physical Exam Triage Vital Signs ED Triage Vitals  Enc Vitals Group     BP 03/02/19 1259 (!) 155/82     Pulse Rate 03/02/19 1259 97     Resp 03/02/19 1259 16     Temp 03/02/19 1259 98.6 F (37 C)     Temp Source 03/02/19 1259 Oral     SpO2 03/02/19 1259 100 %     Weight --      Height --      Head Circumference --      Peak Flow --      Pain Score 03/02/19 1257 0     Pain Loc --      Pain Edu? --      Excl. in GC? --    No data found.  Updated Vital Signs BP (!) 155/82 (BP Location: Right Arm)   Pulse 97   Temp 98.6 F (37 C) (Oral)   Resp 16   LMP  (LMP Unknown)   SpO2 100%       Physical Exam Constitutional:      General: She is not in acute distress.    Appearance: She is well-developed and normal weight.  HENT:     Head: Normocephalic and atraumatic.     Mouth/Throat:     Comments: mask Eyes:     Conjunctiva/sclera: Conjunctivae normal.     Pupils: Pupils are equal, round, and reactive to light.  Neck:     Musculoskeletal: Normal range of motion.  Cardiovascular:     Rate and Rhythm: Normal rate.  Pulmonary:     Effort: Pulmonary effort is normal. No respiratory distress.  Abdominal:     General: Abdomen is flat. There is no distension.     Palpations: Abdomen is soft.     Tenderness: There is no abdominal tenderness.  Musculoskeletal: Normal range of motion.  Skin:    General: Skin is warm and dry.     Comments: No skin tenting  Neurological:     Mental Status: She is alert.  Psychiatric:        Mood and Affect: Mood normal.        Behavior: Behavior normal.      UC Treatments / Results  Labs (all labs ordered are listed, but only abnormal results are displayed) Labs Reviewed - No data to display  EKG   Radiology No  results found.  Procedures Procedures (including critical care time)  Medications Ordered in UC Medications  ondansetron (ZOFRAN-ODT) disintegrating tablet 4 mg (  4 mg Oral Given 03/02/19 1305)  ondansetron (ZOFRAN-ODT) 4 MG disintegrating tablet (has no administration in time range)    Initial Impression / Assessment and Plan / UC Course  I have reviewed the triage vital signs and the nursing notes.  Pertinent labs & imaging results that were available during my care of the patient were reviewed by me and considered in my medical decision making (see chart for details).     Normal vital signs.  No evidence of dehydration.  Since Zofran does not appear to be working well for her, I offered her the choice of doubling her dose or trying Phenergan.  She has used Phenergan successfully in the past. Final Clinical Impressions(s) / UC Diagnoses   Final diagnoses:  Nausea   Discharge Instructions   None    ED Prescriptions    Medication Sig Dispense Auth. Provider   promethazine (PHENERGAN) 25 MG suppository Place 1 suppository (25 mg total) rectally every 8 (eight) hours as needed for nausea or vomiting. 12 each Eustace MooreNelson, Sunnie Odden Sue, MD     PDMP not reviewed this encounter.   Eustace MooreNelson, Kimya Mccahill Sue, MD 03/02/19 [phone]

## 2019-04-20 ENCOUNTER — Emergency Department (HOSPITAL_COMMUNITY): Payer: Medicare (Managed Care)

## 2019-04-20 ENCOUNTER — Inpatient Hospital Stay (HOSPITAL_COMMUNITY)
Admission: EM | Admit: 2019-04-20 | Discharge: 2019-04-26 | DRG: 682 | Disposition: A | Discharge status: Home / Self Care | Payer: Medicare (Managed Care) | Attending: Internal Medicine | Admitting: Internal Medicine

## 2019-04-20 ENCOUNTER — Other Ambulatory Visit: Payer: Self-pay

## 2019-04-20 ENCOUNTER — Encounter (HOSPITAL_COMMUNITY): Payer: Self-pay

## 2019-04-20 DIAGNOSIS — E871 Hypo-osmolality and hyponatremia: Secondary | ICD-10-CM | POA: Diagnosis present

## 2019-04-20 DIAGNOSIS — K449 Diaphragmatic hernia without obstruction or gangrene: Secondary | ICD-10-CM | POA: Diagnosis present

## 2019-04-20 DIAGNOSIS — Z20822 Contact with and (suspected) exposure to covid-19: Secondary | ICD-10-CM | POA: Diagnosis present

## 2019-04-20 DIAGNOSIS — J449 Chronic obstructive pulmonary disease, unspecified: Secondary | ICD-10-CM | POA: Diagnosis present

## 2019-04-20 DIAGNOSIS — Z9049 Acquired absence of other specified parts of digestive tract: Secondary | ICD-10-CM | POA: Diagnosis not present

## 2019-04-20 DIAGNOSIS — R4182 Altered mental status, unspecified: Secondary | ICD-10-CM | POA: Diagnosis not present

## 2019-04-20 DIAGNOSIS — N170 Acute kidney failure with tubular necrosis: Principal | ICD-10-CM | POA: Diagnosis present

## 2019-04-20 DIAGNOSIS — Z825 Family history of asthma and other chronic lower respiratory diseases: Secondary | ICD-10-CM

## 2019-04-20 DIAGNOSIS — I10 Essential (primary) hypertension: Secondary | ICD-10-CM | POA: Diagnosis present

## 2019-04-20 DIAGNOSIS — F329 Major depressive disorder, single episode, unspecified: Secondary | ICD-10-CM | POA: Diagnosis present

## 2019-04-20 DIAGNOSIS — F419 Anxiety disorder, unspecified: Secondary | ICD-10-CM | POA: Diagnosis present

## 2019-04-20 DIAGNOSIS — E860 Dehydration: Secondary | ICD-10-CM | POA: Diagnosis present

## 2019-04-20 DIAGNOSIS — I4581 Long QT syndrome: Secondary | ICD-10-CM | POA: Diagnosis present

## 2019-04-20 DIAGNOSIS — E785 Hyperlipidemia, unspecified: Secondary | ICD-10-CM | POA: Diagnosis present

## 2019-04-20 DIAGNOSIS — I493 Ventricular premature depolarization: Secondary | ICD-10-CM | POA: Diagnosis present

## 2019-04-20 DIAGNOSIS — D638 Anemia in other chronic diseases classified elsewhere: Secondary | ICD-10-CM | POA: Diagnosis present

## 2019-04-20 DIAGNOSIS — M329 Systemic lupus erythematosus, unspecified: Secondary | ICD-10-CM | POA: Diagnosis present

## 2019-04-20 DIAGNOSIS — E876 Hypokalemia: Secondary | ICD-10-CM | POA: Diagnosis present

## 2019-04-20 DIAGNOSIS — Z8052 Family history of malignant neoplasm of bladder: Secondary | ICD-10-CM

## 2019-04-20 DIAGNOSIS — G43909 Migraine, unspecified, not intractable, without status migrainosus: Secondary | ICD-10-CM | POA: Diagnosis present

## 2019-04-20 DIAGNOSIS — G9341 Metabolic encephalopathy: Secondary | ICD-10-CM

## 2019-04-20 DIAGNOSIS — E861 Hypovolemia: Secondary | ICD-10-CM | POA: Diagnosis present

## 2019-04-20 DIAGNOSIS — Z79899 Other long term (current) drug therapy: Secondary | ICD-10-CM

## 2019-04-20 DIAGNOSIS — F17210 Nicotine dependence, cigarettes, uncomplicated: Secondary | ICD-10-CM | POA: Diagnosis present

## 2019-04-20 DIAGNOSIS — Z7901 Long term (current) use of anticoagulants: Secondary | ICD-10-CM

## 2019-04-20 DIAGNOSIS — N179 Acute kidney failure, unspecified: Secondary | ICD-10-CM | POA: Diagnosis not present

## 2019-04-20 DIAGNOSIS — Z7951 Long term (current) use of inhaled steroids: Secondary | ICD-10-CM

## 2019-04-20 DIAGNOSIS — F1210 Cannabis abuse, uncomplicated: Secondary | ICD-10-CM | POA: Diagnosis present

## 2019-04-20 DIAGNOSIS — Z833 Family history of diabetes mellitus: Secondary | ICD-10-CM

## 2019-04-20 DIAGNOSIS — R112 Nausea with vomiting, unspecified: Secondary | ICD-10-CM | POA: Diagnosis present

## 2019-04-20 DIAGNOSIS — Z86718 Personal history of other venous thrombosis and embolism: Secondary | ICD-10-CM

## 2019-04-20 LAB — COMPREHENSIVE METABOLIC PANEL
ALT: 57 U/L — ABNORMAL HIGH (ref 0–44)
AST: 148 U/L — ABNORMAL HIGH (ref 15–41)
Albumin: 4.7 g/dL (ref 3.5–5.0)
Alkaline Phosphatase: 78 U/L (ref 38–126)
BUN: 57 mg/dL — ABNORMAL HIGH (ref 6–20)
CO2: 48 mmol/L — ABNORMAL HIGH (ref 22–32)
Calcium: 9.1 mg/dL (ref 8.9–10.3)
Chloride: <65 mmol/L — CL (ref 98–111)
Creatinine, Ser: 5.43 mg/dL — ABNORMAL HIGH (ref 0.44–1.00)
GFR calc Af Amer: 9 mL/min — ABNORMAL LOW (ref >60)
GFR calc non Af Amer: 8 mL/min — ABNORMAL LOW (ref >60)
Glucose, Bld: 118 mg/dL — ABNORMAL HIGH (ref 70–99)
Potassium: 2.1 mmol/L — CL (ref 3.5–5.1)
Sodium: 131 mmol/L — ABNORMAL LOW (ref 135–145)
Total Bilirubin: 2.7 mg/dL — ABNORMAL HIGH (ref 0.3–1.2)
Total Protein: 8.1 g/dL (ref 6.5–8.1)

## 2019-04-20 LAB — URINALYSIS, COMPLETE (UACMP) WITH MICROSCOPIC
Bilirubin Urine: NEGATIVE
Glucose, UA: 50 mg/dL — AB
Ketones, ur: NEGATIVE mg/dL
Leukocytes,Ua: NEGATIVE
Nitrite: NEGATIVE
Protein, ur: 100 mg/dL — AB
Specific Gravity, Urine: 1.016 (ref 1.005–1.030)
pH: 6 (ref 5.0–8.0)

## 2019-04-20 LAB — RAPID URINE DRUG SCREEN, HOSP PERFORMED
Amphetamines: NOT DETECTED
Barbiturates: NOT DETECTED
Benzodiazepines: NOT DETECTED
Cocaine: NOT DETECTED
Opiates: NOT DETECTED
Tetrahydrocannabinol: POSITIVE — AB

## 2019-04-20 LAB — CBC WITH DIFFERENTIAL/PLATELET
Abs Immature Granulocytes: 0.05 10*3/uL (ref 0.00–0.07)
Basophils Absolute: 0 10*3/uL (ref 0.0–0.1)
Basophils Relative: 0 %
Eosinophils Absolute: 0 10*3/uL (ref 0.0–0.5)
Eosinophils Relative: 0 %
HCT: 37 % (ref 36.0–46.0)
Hemoglobin: 13.2 g/dL (ref 12.0–15.0)
Immature Granulocytes: 0 %
Lymphocytes Relative: 4 %
Lymphs Abs: 0.5 10*3/uL — ABNORMAL LOW (ref 0.7–4.0)
MCH: 32.4 pg (ref 26.0–34.0)
MCHC: 35.7 g/dL (ref 30.0–36.0)
MCV: 90.9 fL (ref 80.0–100.0)
Monocytes Absolute: 1.3 10*3/uL — ABNORMAL HIGH (ref 0.1–1.0)
Monocytes Relative: 10 %
Neutro Abs: 10.4 10*3/uL — ABNORMAL HIGH (ref 1.7–7.7)
Neutrophils Relative %: 86 %
Platelets: 323 10*3/uL (ref 150–400)
RBC: 4.07 MIL/uL (ref 3.87–5.11)
RDW: 13.2 % (ref 11.5–15.5)
WBC: 12.3 10*3/uL — ABNORMAL HIGH (ref 4.0–10.5)
nRBC: 0 % (ref 0.0–0.2)

## 2019-04-20 LAB — I-STAT BETA HCG BLOOD, ED (MC, WL, AP ONLY): I-stat hCG, quantitative: <5 m[IU]/mL (ref <5)

## 2019-04-20 LAB — PROTIME-INR
INR: 1.1 (ref 0.8–1.2)
Prothrombin Time: 13.8 seconds (ref 11.4–15.2)

## 2019-04-20 LAB — APTT: aPTT: 23 seconds — ABNORMAL LOW (ref 24–36)

## 2019-04-20 LAB — RESPIRATORY PANEL BY RT PCR (FLU A&B, COVID)
Influenza A by PCR: NEGATIVE
Influenza B by PCR: NEGATIVE
SARS Coronavirus 2 by RT PCR: NEGATIVE

## 2019-04-20 LAB — HEPARIN LEVEL (UNFRACTIONATED): Heparin Unfractionated: <0.1 IU/mL — ABNORMAL LOW (ref 0.30–0.70)

## 2019-04-20 LAB — POTASSIUM: Potassium: 2 mmol/L — CL (ref 3.5–5.1)

## 2019-04-20 LAB — MAGNESIUM: Magnesium: 1.7 mg/dL (ref 1.7–2.4)

## 2019-04-20 LAB — CBG MONITORING, ED: Glucose-Capillary: 133 mg/dL — ABNORMAL HIGH (ref 70–99)

## 2019-04-20 LAB — ETHANOL: Alcohol, Ethyl (B): <10 mg/dL (ref <10)

## 2019-04-20 MED ORDER — MAGNESIUM SULFATE 2 GM/50ML IV SOLN
2.0000 g | Freq: Once | INTRAVENOUS | Status: AC
  Administered 2019-04-20: 2 g via INTRAVENOUS
  Filled 2019-04-20: qty 50

## 2019-04-20 MED ORDER — TOPIRAMATE 25 MG PO TABS
25.0000 mg | ORAL_TABLET | Freq: Every day | ORAL | Status: DC
  Administered 2019-04-20 – 2019-04-25 (×6): 25 mg via ORAL
  Filled 2019-04-20 (×6): qty 1

## 2019-04-20 MED ORDER — ALBUTEROL SULFATE (2.5 MG/3ML) 0.083% IN NEBU: 2.5000 mg | INHALATION_SOLUTION | Freq: Four times a day (QID) | RESPIRATORY_TRACT | Status: DC | PRN

## 2019-04-20 MED ORDER — GABAPENTIN 600 MG PO TABS: 600.0000 mg | ORAL_TABLET | Freq: Three times a day (TID) | ORAL | Status: DC

## 2019-04-20 MED ORDER — MOMETASONE FURO-FORMOTEROL FUM 100-5 MCG/ACT IN AERO
2.0000 | INHALATION_SPRAY | Freq: Two times a day (BID) | RESPIRATORY_TRACT | Status: DC
  Administered 2019-04-21 – 2019-04-26 (×10): 2 via RESPIRATORY_TRACT
  Filled 2019-04-20: qty 8.8

## 2019-04-20 MED ORDER — HEPARIN (PORCINE) 25000 UT/250ML-% IV SOLN
1200.0000 [IU]/h | INTRAVENOUS | Status: AC
  Administered 2019-04-20: 900 [IU]/h via INTRAVENOUS
  Administered 2019-04-21: 1100 [IU]/h via INTRAVENOUS
  Filled 2019-04-20 (×3): qty 250

## 2019-04-20 MED ORDER — CLONIDINE HCL 0.1 MG PO TABS
0.1000 mg | ORAL_TABLET | Freq: Two times a day (BID) | ORAL | Status: DC
  Administered 2019-04-20 – 2019-04-26 (×12): 0.1 mg via ORAL
  Filled 2019-04-20 (×12): qty 1

## 2019-04-20 MED ORDER — SODIUM CHLORIDE 0.9 % IV SOLN
1.0000 g | INTRAVENOUS | Status: DC
  Administered 2019-04-20 – 2019-04-21 (×2): 1 g via INTRAVENOUS
  Filled 2019-04-20: qty 10
  Filled 2019-04-20 (×2): qty 1

## 2019-04-20 MED ORDER — MIRTAZAPINE 15 MG PO TABS: 45.0000 mg | ORAL_TABLET | Freq: Every day | ORAL | Status: DC

## 2019-04-20 MED ORDER — IBUPROFEN 200 MG PO TABS: 200.0000 mg | ORAL_TABLET | ORAL | Status: DC | PRN

## 2019-04-20 MED ORDER — LEVALBUTEROL HCL 0.63 MG/3ML IN NEBU: 0.6300 mg | INHALATION_SOLUTION | Freq: Three times a day (TID) | RESPIRATORY_TRACT | Status: DC

## 2019-04-20 MED ORDER — ALPRAZOLAM 0.5 MG PO TABS
0.5000 mg | ORAL_TABLET | Freq: Two times a day (BID) | ORAL | Status: DC | PRN
  Administered 2019-04-20 – 2019-04-24 (×3): 0.5 mg via ORAL
  Filled 2019-04-20 (×2): qty 1
  Filled 2019-04-20: qty 2

## 2019-04-20 MED ORDER — POTASSIUM CHLORIDE IN NACL 20-0.9 MEQ/L-% IV SOLN
INTRAVENOUS | Status: AC
  Filled 2019-04-20 (×2): qty 1000

## 2019-04-20 MED ORDER — RIVAROXABAN 20 MG PO TABS: 20.0000 mg | ORAL_TABLET | Freq: Every day | ORAL | Status: DC

## 2019-04-20 MED ORDER — IPRATROPIUM BROMIDE 0.02 % IN SOLN: 0.5000 mg | Freq: Three times a day (TID) | RESPIRATORY_TRACT | Status: DC

## 2019-04-20 MED ORDER — POTASSIUM CHLORIDE CRYS ER 20 MEQ PO TBCR
40.0000 meq | EXTENDED_RELEASE_TABLET | Freq: Every day | ORAL | Status: DC
  Administered 2019-04-20 – 2019-04-22 (×3): 40 meq via ORAL
  Filled 2019-04-20 (×3): qty 2

## 2019-04-20 MED ORDER — IPRATROPIUM BROMIDE 0.02 % IN SOLN
0.5000 mg | Freq: Three times a day (TID) | RESPIRATORY_TRACT | Status: DC
  Administered 2019-04-20 – 2019-04-21 (×2): 0.5 mg via RESPIRATORY_TRACT
  Filled 2019-04-20 (×2): qty 2.5

## 2019-04-20 MED ORDER — LACTATED RINGERS IV BOLUS
1000.0000 mL | Freq: Once | INTRAVENOUS | Status: AC
  Administered 2019-04-20: 1000 mL via INTRAVENOUS

## 2019-04-20 MED ORDER — TIOTROPIUM BROMIDE MONOHYDRATE 18 MCG IN CAPS: 18.0000 ug | ORAL_CAPSULE | Freq: Every day | RESPIRATORY_TRACT | Status: DC

## 2019-04-20 MED ORDER — PRAVASTATIN SODIUM 20 MG PO TABS
10.0000 mg | ORAL_TABLET | Freq: Every day | ORAL | Status: DC
  Administered 2019-04-20 – 2019-04-25 (×6): 10 mg via ORAL
  Filled 2019-04-20 (×6): qty 1

## 2019-04-20 MED ORDER — LEVALBUTEROL HCL 0.63 MG/3ML IN NEBU
0.6300 mg | INHALATION_SOLUTION | Freq: Three times a day (TID) | RESPIRATORY_TRACT | Status: DC
  Administered 2019-04-20 – 2019-04-21 (×2): 0.63 mg via RESPIRATORY_TRACT
  Filled 2019-04-20 (×2): qty 3

## 2019-04-20 MED ORDER — SODIUM CHLORIDE 0.9 % IV BOLUS (SEPSIS)
500.0000 mL | Freq: Once | INTRAVENOUS | Status: AC
  Administered 2019-04-20: 14:00:00 500 mL via INTRAVENOUS

## 2019-04-20 MED ORDER — UMECLIDINIUM BROMIDE 62.5 MCG/INH IN AEPB
1.0000 | INHALATION_SPRAY | Freq: Every day | RESPIRATORY_TRACT | Status: DC
  Administered 2019-04-22 – 2019-04-26 (×4): 1 via RESPIRATORY_TRACT
  Filled 2019-04-20 (×2): qty 7

## 2019-04-20 MED ORDER — ESCITALOPRAM OXALATE 10 MG PO TABS: 10.0000 mg | ORAL_TABLET | Freq: Every day | ORAL | Status: DC

## 2019-04-20 MED ORDER — PROCHLORPERAZINE EDISYLATE 10 MG/2ML IJ SOLN: 10.0000 mg | Freq: Four times a day (QID) | INTRAMUSCULAR | Status: DC | PRN

## 2019-04-20 NOTE — ED Notes (Signed)
Date and time results received: 04/20/19 1518 (use smartphrase ".now" to insert current time)  Test: K+ Chloride Critical Value: 2.1 <65  Name of Provider Notified: PA Tresa Endo  Orders Received? Or Actions Taken?: Orders Received - See Orders for details

## 2019-04-20 NOTE — Progress Notes (Signed)
Pt arrived from ED to rm 1520. BP is slightly elevated at 158/79, HR 103. Pt is disoriented to person, situation, time, and place. Pt is alert. Pt has a one-to-one sitter for safety. Will continue to monitor.

## 2019-04-20 NOTE — Progress Notes (Signed)
Patient moved to [facility] from [place] 2 months ago and living with her father now. RN called her father as a primary contact to collect information related to admission assessment. Her father just knew some information about this patient because she was not with him before. Patient cannot answer any questions during shift assessment. Will pass it along to day shift.

## 2019-04-20 NOTE — ED Provider Notes (Signed)
[facility]-EMERGENCY DEPT Provider Note   CSN: [id number] Arrival date & time: 04/20/19  1336     History Chief Complaint  Patient presents with  . Altered Mental Status    Kathleen Gomez is a 59 y.o. female.  Patient is a 59 year old female with past medical history of hypertension, DVT on Xarelto presenting to the emergency department for altered mental status.  Patient unable to provide any history due to her mental state.  History obtained from nurse who got report from EMS.  Per their report, patient began to have altered mental status sometime yesterday with n/v/d.  Patient arrived with feces down her legs.  At baseline she has a normal mental status.        Past Medical History:  Diagnosis Date  . Depression   . H/O blood clots   . Hypertension   . Lupus [facility])     Patient Active Problem List   Diagnosis Date Noted  . Intractable nausea and vomiting 02/24/2019  . Nausea & vomiting 02/23/2019  . Hypokalemia 02/23/2019  . DVT (deep venous thrombosis) ([facility]) 02/23/2019  . Anxiety 02/23/2019  . Lupus ([facility]) 02/23/2019  . Migraine 02/23/2019    Past Surgical History:  Procedure Laterality Date  . APPENDECTOMY    . CHOLECYSTECTOMY       OB History   No obstetric history on file.     Family History  Problem Relation Age of Onset  . Diabetes Mellitus II Father   . Cancer Father   . Cancer Mother     Social History   Tobacco Use  . Smoking status: Current Every Day Smoker    Packs/day: 1.00    Types: Cigarettes  . Smokeless tobacco: Never Used  Substance Use Topics  . Alcohol use: Not Currently  . Drug use: Yes    Types: Marijuana    Home Medications Prior to Admission medications   Medication Sig Start Date End Date Taking? Authorizing Provider  albuterol (VENTOLIN HFA) 108 (90 Base) MCG/ACT inhaler Inhale 1 puff into the lungs every 6 (six) hours as needed for wheezing or shortness of breath. 02/25/19   Sherryll Burger, Pratik D, DO    ALPRAZolam Prudy Feeler) 0.5 MG tablet Take 0.5 mg by mouth 2 (two) times daily as needed for anxiety or sleep.  02/01/19   [provider]  cloNIDine (CATAPRES) 0.1 MG tablet Take 1 tablet (0.1 mg total) by mouth 2 (two) times daily. 02/25/19 03/27/19  Sherryll Burger, Pratik D, DO  escitalopram (LEXAPRO) 10 MG tablet Take 1 tablet (10 mg total) by mouth daily. 02/25/19 03/27/19  Sherryll Burger, Pratik D, DO  gabapentin (NEURONTIN) 600 MG tablet Take 1 tablet (600 mg total) by mouth 3 (three) times daily. 02/25/19 03/27/19  Sherryll Burger, Pratik D, DO  mirtazapine (REMERON) 45 MG tablet Take 1 tablet (45 mg total) by mouth at bedtime. 02/25/19 03/27/19  Sherryll Burger, Pratik D, DO  potassium chloride (MICRO-K) 10 MEQ CR capsule Take 1 capsule (10 mEq total) by mouth daily. 02/25/19 03/27/19  Sherryll Burger, Pratik D, DO  pravastatin (PRAVACHOL) 10 MG tablet Take 1 tablet (10 mg total) by mouth at bedtime. 02/25/19 03/27/19  Sherryll Burger, Pratik D, DO  promethazine (PHENERGAN) 25 MG suppository Place 1 suppository (25 mg total) rectally every 8 (eight) hours as needed for nausea or vomiting. 03/02/19   Eustace Moore, MD  SPIRIVA HANDIHALER 18 MCG inhalation capsule Place 1 capsule (18 mcg total) into inhaler and inhale daily. 02/25/19   Maurilio Lovely D, DO  SYMBICORT 80-4.5 MCG/ACT inhaler Inhale 1 puff into the lungs 2 (two) times daily. 02/25/19   Manuella Ghazi, Pratik D, DO  topiramate (TOPAMAX) 25 MG tablet Take 1 tablet (25 mg total) by mouth at bedtime. 02/25/19 03/27/19  Manuella Ghazi, Pratik D, DO  XARELTO 20 MG TABS tablet Take 1 tablet (20 mg total) by mouth daily. 02/25/19 03/27/19  Heath Lark D, DO    Allergies    Patient has no known allergies.  Review of Systems   Review of Systems  Unable to perform ROS: Mental status change    Physical Exam Updated Vital Signs BP (!) 180/122   Pulse (!) 109   Temp 98.4 F (36.9 C) (Rectal)   Resp 17   LMP  (LMP Unknown)   SpO2 97%   Physical Exam Vitals and nursing note reviewed.  Constitutional:       General: She is not in acute distress.    Appearance: She is not ill-appearing, toxic-appearing or diaphoretic.  HENT:     Head: Normocephalic.     Nose: Nose normal.     Mouth/Throat:     Mouth: Mucous membranes are dry.  Eyes:     Extraocular Movements: Extraocular movements intact.     Conjunctiva/sclera: Conjunctivae normal.     Pupils: Pupils are equal, round, and reactive to light.  Cardiovascular:     Rate and Rhythm: Regular rhythm. Tachycardia present.  Pulmonary:     Effort: Pulmonary effort is normal.     Breath sounds: Normal breath sounds.  Chest:     Chest wall: No tenderness.  Abdominal:     General: Abdomen is flat. Bowel sounds are normal. There is no distension.     Tenderness: There is no abdominal tenderness.  Musculoskeletal:        General: Normal range of motion.  Skin:    General: Skin is warm and dry.     Capillary Refill: Capillary refill takes less than 2 seconds.     Findings: No bruising, erythema or rash.     Comments: No obvious signs of injury or trauma  Neurological:     Mental Status: She is alert.     Comments: No gross neurological deficit.  She is alert and following commands but is unable to tell me her name or where she is.   Psychiatric:        Mood and Affect: Mood normal.     ED Results / Procedures / Treatments   Labs (all labs ordered are listed, but only abnormal results are displayed) Labs Reviewed  CBG MONITORING, ED - Abnormal; Notable for the following components:      Result Value   Glucose-Capillary 133 (*)    All other components within normal limits  COMPREHENSIVE METABOLIC PANEL  CBC WITH DIFFERENTIAL/PLATELET  URINALYSIS, COMPLETE (UACMP) WITH MICROSCOPIC  RAPID URINE DRUG SCREEN, HOSP PERFORMED  ETHANOL  I-STAT BETA HCG BLOOD, ED ([facility], [facility], [facility] ONLY)    EKG None  Radiology No results found.  Procedures Procedures (including critical care time)  Medications Ordered in ED Medications  sodium chloride 0.9  % bolus 500 mL (has no administration in time range)    ED Course  I have reviewed the triage vital signs and the nursing notes.  Pertinent labs & imaging results that were available during my care of the patient were reviewed by me and considered in my medical decision making (see chart for details).  Clinical Course as of Apr 20 1639  Sun Apr 20, 2019  1414 Patient with AMS. Alert, stable but unable to provide history. Attempted to call contact on file for this patient (father) but received no answer. Patient afebrile, obviously confused, no obvious neuro deficit will work patient up for AMS. Dr. Freida Busman aware.    [KM]    Clinical Course User Index [KM] Jeral Pinch   MDM Rules/Calculators/A&P                      CRITICAL CARE Performed by: Arlyn Dunning   Total critical care time: 35 minutes  Critical care time was exclusive of separately billable procedures and treating other patients.  Critical care was necessary to treat or prevent imminent or life-threatening deterioration.  Critical care was time spent personally by me on the following activities: development of treatment plan with patient and/or surrogate as well as nursing, discussions with consultants, evaluation of patient's response to treatment, examination of patient, obtaining history from patient or surrogate, ordering and performing treatments and interventions, ordering and review of laboratory studies, ordering and review of radiographic studies, pulse oximetry and re-evaluation of patient's condition.  Final Clinical Impression(s) / ED Diagnoses Final diagnoses:  None    Rx / DC Orders ED Discharge Orders    None       Jeral Pinch 04/20/19 1641    Lorre Nick, MD 04/22/19 1553

## 2019-04-20 NOTE — H&P (Signed)
History and Physical  Kathleen Gomez OVF:[id number] DOB: 1960-04-13 DOA: 04/20/2019  Referring physician: Madilyn Hook, PA PCP: System, Pcp Not In  Outpatient Specialists:  Patient coming from: Home  Chief Complaint: Altered mental status   HPI: Kathleen Gomez is a 59 y.o. female with medical history significant for HTN, DVT on Xarelto, lupus, THC abuse, tobacco use disorder, COPD, chronic anxiety and depression, migraine, HLD, previous appendectomy and cholecystectomy who presented to [facility] ED from home via EMS with altered mental status.  Patient is in the room alone with no family members present.  History mainly obtained from chart review, her father, and from [place].  Patient moved to [place] about a month and half ago from [place] to be with her father who is terminally ill with bladder cancer and severe COPD.  Patient's father reports that she has been having nausea and vomiting for the last 3 to 4 days.  Associated with poor oral intake.  Yesterday she became incoherent.  Her inconherence worsened this morning so her father called EMS.  Patient was brought into the ED for further evaluation of her altered mental status.  She arrived in the ED with feces down her legs.  At baseline she has a normal mentation. No personal or family history of seizures.  No recent known alcohol use.  ED Course: CT head nonacute.  COVID-19 screening test negative.  UA with urine WBC between 11-20, will obtain urine culture.  Leukocytosis with WBC 12.3.  Elevated creatinine up to 5.43 with baseline creatinine of 0.91.  Potassium of 2.1.  Magnesium of 1.7.  TRH was asked to admit.   Review of Systems: Review of systems as noted in the HPI. All other systems reviewed and are negative.   Past Medical History:  Diagnosis Date  . Depression   . H/O blood clots   . Hypertension   . Lupus [facility])    Past Surgical History:  Procedure Laterality Date  . APPENDECTOMY    . CHOLECYSTECTOMY      Social History:  reports that  she has been smoking cigarettes. She has been smoking about 1.00 pack per day. She has never used smokeless tobacco. She reports previous alcohol use. She reports current drug use. Drug: Marijuana.   No Known Allergies  Family History  Problem Relation Age of Onset  . Diabetes Mellitus II Father   . Cancer Father   . Cancer Mother     Father with terminal COPD and bladder cancer.  Prior to Admission medications   Medication Sig Start Date End Date Taking? Authorizing Provider  albuterol (VENTOLIN HFA) 108 (90 Base) MCG/ACT inhaler Inhale 1 puff into the lungs every 6 (six) hours as needed for wheezing or shortness of breath. 02/25/19   Manuella Ghazi, Pratik D, DO  ALPRAZolam Duanne Moron) 0.5 MG tablet Take 0.5 mg by mouth 2 (two) times daily as needed for anxiety or sleep.  02/01/19   [provider]  cloNIDine (CATAPRES) 0.1 MG tablet Take 1 tablet (0.1 mg total) by mouth 2 (two) times daily. 02/25/19 03/27/19  Manuella Ghazi, Pratik D, DO  escitalopram (LEXAPRO) 10 MG tablet Take 1 tablet (10 mg total) by mouth daily. 02/25/19 03/27/19  Manuella Ghazi, Pratik D, DO  gabapentin (NEURONTIN) 600 MG tablet Take 1 tablet (600 mg total) by mouth 3 (three) times daily. 02/25/19 03/27/19  Manuella Ghazi, Pratik D, DO  mirtazapine (REMERON) 45 MG tablet Take 1 tablet (45 mg total) by mouth at bedtime. 02/25/19 03/27/19  Manuella Ghazi, Pratik D, DO  potassium  chloride (MICRO-K) 10 MEQ CR capsule Take 1 capsule (10 mEq total) by mouth daily. 02/25/19 03/27/19  Sherryll Burger, Pratik D, DO  pravastatin (PRAVACHOL) 10 MG tablet Take 1 tablet (10 mg total) by mouth at bedtime. 02/25/19 03/27/19  Sherryll Burger, Pratik D, DO  promethazine (PHENERGAN) 25 MG suppository Place 1 suppository (25 mg total) rectally every 8 (eight) hours as needed for nausea or vomiting. 03/02/19   Eustace Moore, MD  SPIRIVA HANDIHALER 18 MCG inhalation capsule Place 1 capsule (18 mcg total) into inhaler and inhale daily. 02/25/19   Sherryll Burger, Pratik D, DO  SYMBICORT 80-4.5 MCG/ACT inhaler Inhale 1  puff into the lungs 2 (two) times daily. 02/25/19   Sherryll Burger, Pratik D, DO  topiramate (TOPAMAX) 25 MG tablet Take 1 tablet (25 mg total) by mouth at bedtime. 02/25/19 03/27/19  Sherryll Burger, Pratik D, DO  XARELTO 20 MG TABS tablet Take 1 tablet (20 mg total) by mouth daily. 02/25/19 03/27/19  Maurilio Lovely D, DO    Physical Exam: BP (!) 109/56 (BP Location: Right Arm)   Pulse (!) 108   Temp 98.9 F (37.2 C) (Oral)   Resp (!) 22   LMP  (LMP Unknown)   SpO2 94%   . General: 59 y.o. year-old female well developed well nourished in no acute distress.  Alert and confused. . Cardiovascular: Regular rate and rhythm with no rubs or gallops.  No thyromegaly or JVD noted.  No lower extremity edema. 2/4 pulses in all 4 extremities. Marland Kitchen Respiratory: Clear to auscultation with no wheezes or rales. Good inspiratory effort. . Abdomen: Soft nontender nondistended with normal bowel sounds x4 quadrants. . Muskuloskeletal: No cyanosis, clubbing or edema noted bilaterally . Neuro: CN II-XII intact, strength, sensation, reflexes . Skin: No ulcerative lesions noted or rashes.  Feces noted in the palm of her hands. . Psychiatry: Judgement and insight appear altered. Mood is appropriate for condition and setting          Labs on Admission:  Basic Metabolic Panel: Recent Labs  Lab 04/20/19 1408 04/20/19 1423  NA 131*  --   K 2.1*  --   CL <65*  --   CO2 48*  --   GLUCOSE 118*  --   BUN 57*  --   CREATININE 5.43*  --   CALCIUM 9.1  --   MG  --  1.7   Liver Function Tests: Recent Labs  Lab 04/20/19 1408  AST 148*  ALT 57*  ALKPHOS 78  BILITOT 2.7*  PROT 8.1  ALBUMIN 4.7   No results for input(s): LIPASE, AMYLASE in the last 168 hours. No results for input(s): AMMONIA in the last 168 hours. CBC: Recent Labs  Lab 04/20/19 1408  WBC 12.3*  NEUTROABS 10.4*  HGB 13.2  HCT 37.0  MCV 90.9  PLT 323   Cardiac Enzymes: No results for input(s): CKTOTAL, CKMB, CKMBINDEX, TROPONINI in the last 168  hours.  BNP (last 3 results) No results for input(s): BNP in the last 8760 hours.  ProBNP (last 3 results) No results for input(s): PROBNP in the last 8760 hours.  CBG: Recent Labs  Lab 04/20/19 1356  GLUCAP 133*    Radiological Exams on Admission: CT ABDOMEN PELVIS WO CONTRAST  Result Date: 04/20/2019 CLINICAL DATA:  Nausea and vomiting. EXAM: CT ABDOMEN AND PELVIS WITHOUT CONTRAST TECHNIQUE: Multidetector CT imaging of the abdomen and pelvis was performed following the standard protocol without IV contrast. COMPARISON:  February 23, 2019 FINDINGS: Lower chest: No acute abnormality.  Hepatobiliary: No focal liver abnormality is seen. Status post cholecystectomy. No biliary dilatation. Pancreas: Unremarkable. No pancreatic ductal dilatation or surrounding inflammatory changes. Spleen: Normal in size without focal abnormality. Adrenals/Urinary Tract: Adrenal glands are unremarkable. Kidneys are normal, without renal calculi, focal lesion, or hydronephrosis. Bladder is unremarkable. Stomach/Bowel: There is a small hiatal hernia. The appendix is not identified. No evidence of bowel wall thickening, distention, or inflammatory changes. Vascular/Lymphatic: There is moderate to marked severity aortic atherosclerosis. An inferior vena cava filter is noted. No enlarged abdominal or pelvic lymph nodes. Reproductive: Uterus and bilateral adnexa are unremarkable. Other: No abdominal wall hernia or abnormality. No abdominopelvic ascites. Musculoskeletal: Mild multilevel degenerative changes are seen throughout the lumbar spine. IMPRESSION: 1. Small hiatal hernia. 2. Evidence of prior cholecystectomy. 3. Inferior vena cava filter. Electronically Signed   By: Aram Candela M.D.   On: 04/20/2019 15:54   CT HEAD WO CONTRAST  Result Date: 04/20/2019 CLINICAL DATA:  Encephalopathy EXAM: CT HEAD WITHOUT CONTRAST TECHNIQUE: Contiguous axial images were obtained from the base of the skull through the vertex  without intravenous contrast. COMPARISON:  None. FINDINGS: Brain: No evidence of acute infarction, hemorrhage, extra-axial collection, ventriculomegaly, or mass effect. Generalized cerebral atrophy. Vascular: Cerebrovascular atherosclerotic calcifications are noted. Skull: Negative for fracture or focal lesion. Sinuses/Orbits: Visualized portions of the orbits are unremarkable. Visualized portions of the paranasal sinuses are unremarkable. Visualized portions of the mastoid air cells are unremarkable. Other: None. IMPRESSION: No acute intracranial pathology. Electronically Signed   By: Elige Ko   On: 04/20/2019 14:28   DG Chest Portable 1 View  Result Date: 04/20/2019 CLINICAL DATA:  Altered mental status. EXAM: PORTABLE CHEST 1 VIEW COMPARISON:  None. FINDINGS: The heart size and mediastinal contours are within normal limits. Both lungs are clear. The visualized skeletal structures are unremarkable. IMPRESSION: No active disease.  No evidence of pneumonia. Electronically Signed   By: Bary Richard M.D.   On: 04/20/2019 14:31    EKG: I independently viewed the EKG done and my findings are as followed: Sinus tachycardia with a rate of 106.  QTc 536.  Assessment/Plan Present on Admission: . AKI (acute kidney injury) (HCC)  Active Problems:   AKI (acute kidney injury) (HCC)  AKI, likely prerenal in the setting of dehydration from poor oral intake, nausea and vomiting. Presented with creatinine of 5.43 with GFR of 8 Baseline creatinine of 0.91 with GFR greater than 60 (02/25/19) Continue IV fluid hydration Monitor urine output Avoid nephrotoxins and hypotension Obtain Daily BMPs  Acute metabolic encephalopathy, unclear etiology, likely multifactorial secondary to dehydration versus presumed UTI CT head non acute Reorient as needed Fall precautions One-to-one for patient's own safety  Intractable nausea and vomiting suspect cannabinoid hyperemesis syndrome No recurrent nausea and  vomiting in the ED Positive THC on admission UDS. Treat symptomatically Avoid QTC prolonging agents, may use Compazine  Presumed UTI Presented with urine analysis with urine WBC between 11 and 20 and serum WBC 12.3K Due to unexplained altered mental status we will treat empirically with Rocephin for presumed UTI Urine culture pending.  If negative DC Rocephin  Severe hypokalemia Presented with potassium of 2.1 Repleted with IV potassium Repeat serum potassium  Elevated AST ALT Unclear etiology Trend and repeat CMP in the morning  Hyperbilirubinemia Presented with bilirubin 2.7 Unclear etiology CT abdomen pelvis without contrast unrevealing. Repeat CMP in the AM  Hypomagnesemia Magnesium 1.7 Repleted with IV magnesium 2.1  Sinus tachycardia Suspect in the setting of dehydration Continue IV  fluid Continue to monitor on telemetry  Prolonged QTC Avoid QTC prolonging agents Repeat twelve-lead EKG in the morning  Essential hypertension Restart home medications  History of DVT Restart Xarelto  Chronic anxiety/depression Restart home medications  Hyperlipidemia Hold statin due to elevated AST ALT  History of migraines Resume home medications  COPD Resume home medications  Marijuana use disorder/tobacco use disorder Provide counseling when no longer altered.   DVT prophylaxis: Xarelto  Code Status: Full code per the patient herself.  Family Communication: Discussed plan of care with the patient's father.  All questions answered.  Disposition Plan: Admit to telemetry unit  Consults called: None  Admission status: Inpatient status.    Darlin Drop MD [facility] Pager [phone]  If 7PM-7AM, please contact night-coverage [email or web address] Password [facility]  04/20/2019, 4:37 PM

## 2019-04-20 NOTE — ED Notes (Signed)
Pts O2 saturation at 88 with good waveform. Pt placed on 2L oxygen to correct levels.

## 2019-04-20 NOTE — Progress Notes (Signed)
ANTICOAGULATION CONSULT NOTE - Initial Consult  Pharmacy Consult for heparin Indication: history of VTE, xarelto on hold due to renal function  No Known Allergies  Patient Measurements: Height: 5' 4.02" (162.6 cm) Weight: 139 lb 1.8 oz (63.1 kg) IBW/kg (Calculated) : 54.74 Heparin Dosing Weight: n/a. Use TBW = 63 kg  Vital Signs: Temp: 99.3 F (37.4 C) (01/24 1907) Temp Source: Oral (01/24 1907) BP: 154/79 (01/24 1907) Pulse Rate: 102 (01/24 1907)  Labs: Recent Labs    04/20/19 1408  HGB 13.2  HCT 37.0  PLT 323  CREATININE 5.43*    Estimated Creatinine Clearance: 9.6 mL/min (A) (by C-G formula based on SCr of 5.43 mg/dL (H)).   Medical History: Past Medical History:  Diagnosis Date  . Depression   . H/O blood clots   . Hypertension   . Lupus (HCC)     Assessment: Pt admitted with AMS and AKI. Pt recently moved to [place] from [facility] and unable to provide much history given AMS. PMH significant for hx DVT on rivaroxaban PTA. Due to AKI and CrCl < 10 mL/min on admission, anticoagulation being transitioned to heparin drip.   Last xarelto dose: unknown  Today, 04/20/19  CBC: Hgb 13.2, Plt 323. WNL  SCr 5.43, CrCl 9 mL/min  Ordered baseline STAT: protime/INR, aPTT, HL  Goal of Therapy:  Heparin level 0.3-0.7 units/ml Monitor platelets by anticoagulation protocol: Yes   Plan:   No bolus since unable to confirm when last dose of rivaroxaban was taken  Start heparin infusion at 900 units/hr  Check HL or aPTT in 8 hours. If baseline HL is elevated, will need to monitor using aPTT until aPTT and HL correlate.   CBC and HL daily  Monitor for signs/symptoms of bleeding  Follow along for transition back to rivaroxaban once renal function improves  Cindi Carbon, PharmD 04/20/2019,9:25 PM

## 2019-04-20 NOTE — ED Triage Notes (Signed)
Pt BIBA from home. Pt's father states pt has been having N/V/D x 4 days. Yesterday, pt became confused and not acting herself- pt refused to be seen. Today, pt has been falling, uncoordinated. Upon EMS arrival, pt naked on toilet having diarrhea. Pt states "yes" to anything asked.

## 2019-04-20 NOTE — ED Provider Notes (Signed)
Medical screening examination/treatment/procedure(s) were conducted as a shared visit with non-physician practitioner(s) and myself.  I personally evaluated the patient during the encounter.  EKG Interpretation  Date/Time:  Sunday April 20 2019 13:50:22 EST Ventricular Rate:  106 PR Interval:    QRS Duration: 87 QT Interval:  403 QTC Calculation: 536 R Axis:   71 Text Interpretation: Sinus tachycardia Multiple ventricular premature complexes Right atrial enlargement Consider left ventricular hypertrophy Prolonged QT interval Confirmed by Lorre Nick ([id number]) on 04/20/2019 3:4:82 PM  59 year old female here with altered mental status nausea vomiting or diarrhea.  Has been related to cannabis use in the past.  Here she is severely hypokalemic as well as evidence of AKI.  Will replenish potassium, IV hydrate, admit   Lorre Nick, MD 04/20/19 1538

## 2019-04-20 NOTE — ED Notes (Signed)
Informed by outgoing nurse that pt has been trying to get out of bed. Pt is redirectable at this time but need constant redirection. Safety sitter requested due to AMS and risk of fall.

## 2019-04-21 LAB — CBC WITH DIFFERENTIAL/PLATELET
Abs Immature Granulocytes: 0.03 10*3/uL (ref 0.00–0.07)
Basophils Absolute: 0 10*3/uL (ref 0.0–0.1)
Basophils Relative: 0 %
Eosinophils Absolute: 0 10*3/uL (ref 0.0–0.5)
Eosinophils Relative: 0 %
HCT: 33.1 % — ABNORMAL LOW (ref 36.0–46.0)
Hemoglobin: 11.3 g/dL — ABNORMAL LOW (ref 12.0–15.0)
Immature Granulocytes: 0 %
Lymphocytes Relative: 7 %
Lymphs Abs: 0.6 10*3/uL — ABNORMAL LOW (ref 0.7–4.0)
MCH: 32 pg (ref 26.0–34.0)
MCHC: 34.1 g/dL (ref 30.0–36.0)
MCV: 93.8 fL (ref 80.0–100.0)
Monocytes Absolute: 0.7 10*3/uL (ref 0.1–1.0)
Monocytes Relative: 9 %
Neutro Abs: 7.3 10*3/uL (ref 1.7–7.7)
Neutrophils Relative %: 84 %
Platelets: 239 10*3/uL (ref 150–400)
RBC: 3.53 MIL/uL — ABNORMAL LOW (ref 3.87–5.11)
RDW: 13.2 % (ref 11.5–15.5)
WBC: 8.6 10*3/uL (ref 4.0–10.5)
nRBC: 0 % (ref 0.0–0.2)

## 2019-04-21 LAB — COMPREHENSIVE METABOLIC PANEL
ALT: 52 U/L — ABNORMAL HIGH (ref 0–44)
AST: 100 U/L — ABNORMAL HIGH (ref 15–41)
Albumin: 3.7 g/dL (ref 3.5–5.0)
Alkaline Phosphatase: 67 U/L (ref 38–126)
Anion gap: 18 — ABNORMAL HIGH (ref 5–15)
BUN: 56 mg/dL — ABNORMAL HIGH (ref 6–20)
CO2: 42 mmol/L — ABNORMAL HIGH (ref 22–32)
Calcium: 8.9 mg/dL (ref 8.9–10.3)
Chloride: 71 mmol/L — ABNORMAL LOW (ref 98–111)
Creatinine, Ser: 3.74 mg/dL — ABNORMAL HIGH (ref 0.44–1.00)
GFR calc Af Amer: 14 mL/min — ABNORMAL LOW (ref >60)
GFR calc non Af Amer: 13 mL/min — ABNORMAL LOW (ref >60)
Glucose, Bld: 98 mg/dL (ref 70–99)
Potassium: 2.8 mmol/L — ABNORMAL LOW (ref 3.5–5.1)
Sodium: 131 mmol/L — ABNORMAL LOW (ref 135–145)
Total Bilirubin: 1.9 mg/dL — ABNORMAL HIGH (ref 0.3–1.2)
Total Protein: 6.8 g/dL (ref 6.5–8.1)

## 2019-04-21 LAB — HEPARIN LEVEL (UNFRACTIONATED)
Heparin Unfractionated: 0.12 IU/mL — ABNORMAL LOW (ref 0.30–0.70)
Heparin Unfractionated: 0.37 IU/mL (ref 0.30–0.70)

## 2019-04-21 LAB — POTASSIUM: Potassium: 3 mmol/L — ABNORMAL LOW (ref 3.5–5.1)

## 2019-04-21 MED ORDER — POTASSIUM CHLORIDE IN NACL 20-0.9 MEQ/L-% IV SOLN
INTRAVENOUS | Status: AC
  Filled 2019-04-21: qty 1000

## 2019-04-21 MED ORDER — LEVALBUTEROL HCL 0.63 MG/3ML IN NEBU
0.6300 mg | INHALATION_SOLUTION | Freq: Two times a day (BID) | RESPIRATORY_TRACT | Status: DC
  Administered 2019-04-21 – 2019-04-22 (×2): 0.63 mg via RESPIRATORY_TRACT
  Filled 2019-04-21 (×3): qty 3

## 2019-04-21 MED ORDER — IPRATROPIUM BROMIDE 0.02 % IN SOLN: 0.5000 mg | Freq: Two times a day (BID) | RESPIRATORY_TRACT | Status: DC

## 2019-04-21 MED ORDER — POTASSIUM CHLORIDE 10 MEQ/100ML IV SOLN
10.0000 meq | INTRAVENOUS | Status: AC
  Administered 2019-04-21 (×6): 10 meq via INTRAVENOUS
  Filled 2019-04-21 (×6): qty 100

## 2019-04-21 MED ORDER — HEPARIN BOLUS VIA INFUSION
2000.0000 [IU] | Freq: Once | INTRAVENOUS | Status: AC
  Administered 2019-04-21: 09:00:00 2000 [IU] via INTRAVENOUS
  Filled 2019-04-21: qty 2000

## 2019-04-21 NOTE — Evaluation (Signed)
Physical Therapy Evaluation Patient Details Name: Kathleen Gomez MRN: [id number] DOB: December 30, 1960 Today's Date: 04/21/2019   History of Present Illness  Kathleen Gomez is a 59 y.o. female with medical history significant for HTN, DVT on Xarelto, lupus, THC abuse, tobacco use disorder, COPD, chronic anxiety and depression, migraine, HLD, previous appendectomy and cholecystectomy. Patient presented to [facility] ED from home via EMS with altered mental status. Patient moved to [place] about a month and half ago from [place] to be with her father who is terminally ill with bladder cancer and severe COPD. Patient was brought into the ED for further evaluation of her altered mental status. At baseline she has a normal mentation.    Clinical Impression  Kathleen Gomez is 59 y.o. female admitted with above HPI and diagnosis. Patient is currently limited by functional impairments below (see PT problem list). Patient moved to [place] to assist her father and is independent at baseline. Patient will benefit from continued skilled PT interventions to address impairments and progress independence with mobility, recommending 24/7 supervision for mobility and safety concerns. At this time pt would require SNF placement due to decreased caregiver support at home, pending progress and improved mental status pt may return home with HHPT. Acute PT will follow and progress as able.     Follow Up Recommendations Supervision for mobility/OOB(SNF vs. HHPT pending progress with cognition)    Equipment Recommendations  Other (comment)(TBD pending progress)    Recommendations for Other Services       Precautions / Restrictions Precautions Precautions: Fall Restrictions Weight Bearing Restrictions: No      Mobility  Bed Mobility Overal bed mobility: Needs Assistance Bed Mobility: Supine to Sit     Supine to sit: Min guard     General bed mobility comments: no assist required, guard for safety  Transfers Overall transfer level:  Needs assistance Equipment used: Rolling walker (2 wheeled) Transfers: Sit to/from Stand Sit to Stand: Min guard         General transfer comment: cues for safe hand placement and technique with RW, min guard for safety; pt able to rise without assist  Ambulation/Gait Ambulation/Gait assistance: Min assist Gait Distance (Feet): 80 Feet Assistive device: Rolling walker (2 wheeled) Gait Pattern/deviations: Step-through pattern;Decreased stride length;Narrow base of support Gait velocity: decreased   General Gait Details: cues fors afe management and proximity to RW, no overt LOB, assist to manage RW position to negotiate turns. pt ambulated on RA and SpO2 remained between 93-98%  Stairs            Wheelchair Mobility    Modified Rankin (Stroke Patients Only)       Balance Overall balance assessment: Needs assistance Sitting-balance support: Feet supported Sitting balance-Leahy Scale: Good     Standing balance support: During functional activity;Bilateral upper extremity supported Standing balance-Leahy Scale: Fair              Pertinent Vitals/Pain Pain Assessment: No/denies pain    Home Living Family/patient expects to be discharged to:: Private residence Living Arrangements: Parent Available Help at Discharge: Family Type of Home: House Home Access: Ramped entrance;Stairs to enter Entrance Stairs-Rails: Right;Left;Can reach both Entrance Stairs-Number of Steps: 3 stairs or ramp Home Layout: One level Home Equipment: Grab bars - tub/shower Additional Comments: Pt unable to provide home environment history    Prior Function Level of Independence: Independent         Comments: daughter lives in [place] but came up to stay with shopping and getting food shopping  he can cook and drive and she pushed him in wc, might be disabled     Hand Dominance        Extremity/Trunk Assessment   Upper Extremity Assessment Upper Extremity Assessment: Overall WFL  for tasks assessed    Lower Extremity Assessment Lower Extremity Assessment: Overall WFL for tasks assessed    Cervical / Trunk Assessment Cervical / Trunk Assessment: Normal  Communication   Communication: No difficulties  Cognition Arousal/Alertness: Awake/alert Behavior During Therapy: WFL for tasks assessed/performed Overall Cognitive Status: Impaired/Different from baseline Area of Impairment: Orientation;Memory          Orientation Level: Disoriented to;Person;Place;Time;Situation   Memory: Decreased short-term memory         General Comments: pt pleasantly confused and agreeable to particiapte in therapy. pt able to follow simple and complex commands with extra time      General Comments   Exercises    Assessment/Plan    PT Assessment Patient needs continued PT services  PT Problem List Decreased activity tolerance;Decreased balance;Decreased coordination;Decreased mobility;Decreased knowledge of use of DME;Decreased cognition;Decreased safety awareness       PT Treatment Interventions DME instruction;Gait training;Functional mobility training;Stair training;Therapeutic activities;Balance training;Therapeutic exercise;Patient/family education    PT Goals (Current goals can be found in the Care Plan section)  Acute Rehab PT Goals PT Goal Formulation: Patient unable to participate in goal setting Time For Goal Achievement: 05/05/19 Potential to Achieve Goals: Good    Frequency Min 3X/week   Barriers to discharge Decreased caregiver support pt was in [place] to care for/assist her father who is severly limited by COPD. She was assisting with ADL's and taking him to his doctor's appointments. He is unable to assist her at this time.       AM-PAC PT "6 Clicks" Mobility  Outcome Measure Help needed turning from your back to your side while in a flat bed without using bedrails?: None Help needed moving from lying on your back to sitting on the side of a flat bed  without using bedrails?: None Help needed moving to and from a bed to a chair (including a wheelchair)?: A Little Help needed standing up from a chair using your arms (e.g., wheelchair or bedside chair)?: A Little Help needed to walk in hospital room?: A Little Help needed climbing 3-5 steps with a railing? : A Little 6 Click Score: 20    End of Session Equipment Utilized During Treatment: Gait belt Activity Tolerance: Patient tolerated treatment well Patient left: in chair;with call bell/phone within reach;with chair alarm set Nurse Communication: Mobility status PT Visit Diagnosis: Other abnormalities of gait and mobility (R26.89);Difficulty in walking, not elsewhere classified (R26.2)    Time: 1478-2956 PT Time Calculation (min) (ACUTE ONLY): 24 min   Charges:   PT Evaluation $PT Eval Low Complexity: 1 Low PT Treatments $Gait Training: 8-22 mins        Verner Mould, DPT Physical Therapist with [facility] [phone]  04/21/2019 4:04 PM

## 2019-04-21 NOTE — Progress Notes (Signed)
PROGRESS NOTE  Kathleen Gomez OIN:[id number] DOB: 20-Dec-1960 DOA: 04/20/2019 PCP: System, Pcp Not In  HPI/Recap of past 24 hours: Kathleen Gomez is a 59 y.o. female with medical history significant for HTN, DVT on Xarelto, lupus, THC abuse, tobacco use disorder, COPD, chronic anxiety and depression, migraine, HLD, previous appendectomy and cholecystectomy who presented to [facility] ED from home via EMS with altered mental status.  Patient is in the room alone with no family members present.  History mainly obtained from chart review, her father, and from EDP.  Patient moved to [place] about a month and half ago from [place] to be with her father who is terminally ill with bladder cancer and severe COPD.  Patient's father reports that she has been having nausea and vomiting for the last 3 to 4 days.  Associated with poor oral intake.  Yesterday she became incoherent.  Her inconherence worsened this morning so her father called EMS.  Patient was brought into the ED for further evaluation of her altered mental status.  She arrived in the ED with feces down her legs.  At baseline she has a normal mentation. No personal or family history of seizures.  No recent known alcohol use.  ED Course: CT head nonacute.  COVID-19 screening test negative.  UA with urine WBC between 11-20, will obtain urine culture.  Leukocytosis with WBC 12.3.  Elevated creatinine up to 5.43 with baseline creatinine of 0.91.  Potassium of 2.1.  Magnesium of 1.7.  TRH was asked to admit.  04/21/19: Seen and examined.  Mentation mildly improved.  Alert and oriented to place.  Some confusion.  Assessment/Plan: Active Problems:   AKI (acute kidney injury) (HCC)  AKI, likely prerenal in the setting of dehydration from poor oral intake, nausea and vomiting. Presented with creatinine of 5.43 with GFR of 8 Baseline creatinine of 0.91 with GFR greater than 60 (02/25/19) Creatinine is trending down Continue IV fluid hydration Continue to monitor urine  output Continue to avoid nephrotoxins and hypotension. Continue daily BMPs  Acute metabolic encephalopathy, unclear etiology, likely multifactorial secondary to dehydration versus presumed UTI CT head non acute Reorient as needed Fall precautions One-to-one for patient's own safety Still disoriented.  Intractable nausea and vomiting suspect cannabinoid hyperemesis syndrome No recurrent nausea and vomiting in the ED Positive THC on admission UDS. Treat symptomatically Avoid QTC prolonging agents, may use Compazine  Presumed UTI Presented with urine analysis with urine WBC between 11 and 20 and serum WBC 12.3K Due to unexplained altered mental status we will treat empirically with Rocephin for presumed UTI Urine culture in process.  If negative DC Rocephin  Refractory severe hypokalemia Presented with potassium of 2.1>> 2.8 Repleted with IV potassium Repeat serum potassium this afternoon  Elevated AST ALT Unclear etiology Trend and repeat CMP in the morning Improving and trending down.  Hyperbilirubinemia Presented with bilirubin 2.7>>1.9 Unclear etiology CT abdomen pelvis without contrast unrevealing. Improving and trending down.  Hypomagnesemia Magnesium 1.7 Repleted with IV magnesium 2 grams once  Resolving sinus tachycardia Suspect in the setting of dehydration Continue IV fluid Continue to monitor on telemetry  Prolonged QTC Avoid QTC prolonging agents Repeat twelve-lead EKG in the morning  Essential hypertension Blood pressure is not at goal Continue home medications  History of DVT Currently on heparin drip Transitional care team consulted to check insurance coverage for Eliquis.  Chronic anxiety/depression Continue home medications  Hyperlipidemia Hold statin due to elevated AST ALT  History of migraines Continue home medications  COPD  Continue home medications  Marijuana use disorder/tobacco use disorder Provide counseling  when no longer altered.   DVT prophylaxis:  Heparin drip  Code Status: Full code per the patient herself.  Family Communication: Discussed plan of care with the patient's father.  All questions answered.  Disposition Plan:  Patient is from home.  Anticipate discharge to home likely in the next 48 hours.  Barriers to discharge ongoing treatment for AKI with IV fluid and repletion of electrolytes intravenously.  Altered not back to baseline.  Consults called: None    Objective: Vitals:   04/20/19 1951 04/20/19 2039 04/21/19 0542 04/21/19 0738  BP:   (!) 152/74   Pulse:   96   Resp:   20   Temp:   98.8 F (37.1 C)   TempSrc:   Oral   SpO2:  96% 95% 92%  Weight: 63.1 kg     Height: 5' 4.02" (1.626 m)       Intake/Output Summary (Last 24 hours) at 04/21/2019 1345 Last data filed at 04/21/2019 3790 Gross per 24 hour  Intake 1479.7 ml  Output --  Net 1479.7 ml   Filed Weights   04/20/19 1951  Weight: 63.1 kg    Exam:  . General: 59 y.o. year-old female well developed well nourished in no acute distress.  Alert and oriented x1. . Cardiovascular: Regular rate and rhythm with no rubs or gallops.  No thyromegaly or JVD noted.   Marland Kitchen Respiratory: Mild rales at bases no wheezing noted.  Poor inspiratory effort.  . Abdomen: Soft nontender nondistended with normal bowel sounds x4 quadrants. . Musculoskeletal: No lower extremity edema. 2/4 pulses in all 4 extremities. Marland Kitchen Psychiatry: Mood is appropriate for condition   Data Reviewed: CBC: Recent Labs  Lab 04/20/19 1408 04/21/19 0804  WBC 12.3* 8.6  NEUTROABS 10.4* 7.3  HGB 13.2 11.3*  HCT 37.0 33.1*  MCV 90.9 93.8  PLT 323 239   Basic Metabolic Panel: Recent Labs  Lab 04/20/19 1408 04/20/19 1423 04/20/19 2134 04/21/19 0804  NA 131*  --   --  131*  K 2.1*  --  2.0* 2.8*  CL <65*  --   --  71*  CO2 48*  --   --  42*  GLUCOSE 118*  --   --  98  BUN 57*  --   --  56*  CREATININE 5.43*  --   --  3.74*  CALCIUM  9.1  --   --  8.9  MG  --  1.7  --   --    GFR: Estimated Creatinine Clearance: 14 mL/min (A) (by C-G formula based on SCr of 3.74 mg/dL (H)). Liver Function Tests: Recent Labs  Lab 04/20/19 1408 04/21/19 0804  AST 148* 100*  ALT 57* 52*  ALKPHOS 78 67  BILITOT 2.7* 1.9*  PROT 8.1 6.8  ALBUMIN 4.7 3.7   No results for input(s): LIPASE, AMYLASE in the last 168 hours. No results for input(s): AMMONIA in the last 168 hours. Coagulation Profile: Recent Labs  Lab 04/20/19 2212  INR 1.1   Cardiac Enzymes: No results for input(s): CKTOTAL, CKMB, CKMBINDEX, TROPONINI in the last 168 hours. BNP (last 3 results) No results for input(s): PROBNP in the last 8760 hours. HbA1C: No results for input(s): HGBA1C in the last 72 hours. CBG: Recent Labs  Lab 04/20/19 1356  GLUCAP 133*   Lipid Profile: No results for input(s): CHOL, HDL, LDLCALC, TRIG, CHOLHDL, LDLDIRECT in the last 72 hours. Thyroid Function  Tests: No results for input(s): TSH, T4TOTAL, FREET4, T3FREE, THYROIDAB in the last 72 hours. Anemia Panel: No results for input(s): VITAMINB12, FOLATE, FERRITIN, TIBC, IRON, RETICCTPCT in the last 72 hours. Urine analysis:    Component Value Date/Time   COLORURINE AMBER (A) 04/20/2019 1443   APPEARANCEUR CLOUDY (A) 04/20/2019 1443   LABSPEC 1.016 04/20/2019 1443   PHURINE 6.0 04/20/2019 1443   GLUCOSEU 50 (A) 04/20/2019 1443   HGBUR LARGE (A) 04/20/2019 1443   BILIRUBINUR NEGATIVE 04/20/2019 1443   KETONESUR NEGATIVE 04/20/2019 1443   PROTEINUR 100 (A) 04/20/2019 1443   NITRITE NEGATIVE 04/20/2019 1443   LEUKOCYTESUR NEGATIVE 04/20/2019 1443   Sepsis Labs: @LABRCNTIP (procalcitonin:4,lacticidven:4)  ) Recent Results (from the past 240 hour(s))  Respiratory Panel by RT PCR (Flu A&B, Covid) - Urine, Clean Catch     Status: None   Collection Time: 04/20/19  2:43 PM   Specimen: Urine, Clean Catch  Result Value Ref Range Status   SARS Coronavirus 2 by RT PCR NEGATIVE  NEGATIVE Final    Comment: (NOTE) SARS-CoV-2 target nucleic acids are NOT DETECTED. The SARS-CoV-2 RNA is generally detectable in upper respiratoy specimens during the acute phase of infection. The lowest concentration of SARS-CoV-2 viral copies this assay can detect is 131 copies/mL. A negative result does not preclude SARS-Cov-2 infection and should not be used as the sole basis for treatment or other patient management decisions. A negative result may occur with  improper specimen collection/handling, submission of specimen other than nasopharyngeal swab, presence of viral mutation(s) within the areas targeted by this assay, and inadequate number of viral copies (<131 copies/mL). A negative result must be combined with clinical observations, patient history, and epidemiological information. The expected result is Negative. Fact Sheet for Patients:  04/22/19 Fact Sheet for Healthcare Providers:  https://www.moore.com/ This test is not yet ap proved or cleared by the https://www.young.biz/ FDA and  has been authorized for detection and/or diagnosis of SARS-CoV-2 by FDA under an Emergency Use Authorization (EUA). This EUA will remain  in effect (meaning this test can be used) for the duration of the COVID-19 declaration under Section 564(b)(1) of the Act, 21 U.S.C. section 360bbb-3(b)(1), unless the authorization is terminated or revoked sooner.    Influenza A by PCR NEGATIVE NEGATIVE Final   Influenza B by PCR NEGATIVE NEGATIVE Final    Comment: (NOTE) The Xpert Xpress SARS-CoV-2/FLU/RSV assay is intended as an aid in  the diagnosis of influenza from Nasopharyngeal swab specimens and  should not be used as a sole basis for treatment. Nasal washings and  aspirates are unacceptable for Xpert Xpress SARS-CoV-2/FLU/RSV  testing. Fact Sheet for Patients: [place] Fact Sheet for Healthcare  Providers: https://www.moore.com/ This test is not yet approved or cleared by the https://www.young.biz/ FDA and  has been authorized for detection and/or diagnosis of SARS-CoV-2 by  FDA under an Emergency Use Authorization (EUA). This EUA will remain  in effect (meaning this test can be used) for the duration of the  Covid-19 declaration under Section 564(b)(1) of the Act, 21  U.S.C. section 360bbb-3(b)(1), unless the authorization is  terminated or revoked. Performed at [facility], [address] [place], [place]       Studies: CT ABDOMEN PELVIS WO CONTRAST  Result Date: 04/20/2019 CLINICAL DATA:  Nausea and vomiting. EXAM: CT ABDOMEN AND PELVIS WITHOUT CONTRAST TECHNIQUE: Multidetector CT imaging of the abdomen and pelvis was performed following the standard protocol without IV contrast. COMPARISON:  February 23, 2019  FINDINGS: Lower chest: No acute abnormality. Hepatobiliary: No focal liver abnormality is seen. Status post cholecystectomy. No biliary dilatation. Pancreas: Unremarkable. No pancreatic ductal dilatation or surrounding inflammatory changes. Spleen: Normal in size without focal abnormality. Adrenals/Urinary Tract: Adrenal glands are unremarkable. Kidneys are normal, without renal calculi, focal lesion, or hydronephrosis. Bladder is unremarkable. Stomach/Bowel: There is a small hiatal hernia. The appendix is not identified. No evidence of bowel wall thickening, distention, or inflammatory changes. Vascular/Lymphatic: There is moderate to marked severity aortic atherosclerosis. An inferior vena cava filter is noted. No enlarged abdominal or pelvic lymph nodes. Reproductive: Uterus and bilateral adnexa are unremarkable. Other: No abdominal wall hernia or abnormality. No abdominopelvic ascites. Musculoskeletal: Mild multilevel degenerative changes are seen throughout the lumbar spine. IMPRESSION: 1. Small hiatal hernia. 2. Evidence of prior  cholecystectomy. 3. Inferior vena cava filter. Electronically Signed   By: Virgina Norfolk M.D.   On: 04/20/2019 15:54   CT HEAD WO CONTRAST  Result Date: 04/20/2019 CLINICAL DATA:  Encephalopathy EXAM: CT HEAD WITHOUT CONTRAST TECHNIQUE: Contiguous axial images were obtained from the base of the skull through the vertex without intravenous contrast. COMPARISON:  None. FINDINGS: Brain: No evidence of acute infarction, hemorrhage, extra-axial collection, ventriculomegaly, or mass effect. Generalized cerebral atrophy. Vascular: Cerebrovascular atherosclerotic calcifications are noted. Skull: Negative for fracture or focal lesion. Sinuses/Orbits: Visualized portions of the orbits are unremarkable. Visualized portions of the paranasal sinuses are unremarkable. Visualized portions of the mastoid air cells are unremarkable. Other: None. IMPRESSION: No acute intracranial pathology. Electronically Signed   By: Kathreen Devoid   On: 04/20/2019 14:28   DG Chest Portable 1 View  Result Date: 04/20/2019 CLINICAL DATA:  Altered mental status. EXAM: PORTABLE CHEST 1 VIEW COMPARISON:  None. FINDINGS: The heart size and mediastinal contours are within normal limits. Both lungs are clear. The visualized skeletal structures are unremarkable. IMPRESSION: No active disease.  No evidence of pneumonia. Electronically Signed   By: Franki Cabot M.D.   On: 04/20/2019 14:31    Scheduled Meds: . cloNIDine  0.1 mg Oral BID  . levalbuterol  0.63 mg Nebulization BID  . mometasone-formoterol  2 puff Inhalation BID  . potassium chloride  40 mEq Oral Daily  . pravastatin  10 mg Oral QHS  . topiramate  25 mg Oral QHS  . umeclidinium bromide  1 puff Inhalation Daily    Continuous Infusions: . 0.9 % NaCl with KCl 20 mEq / L 50 mL/hr at 04/21/19 1201  . cefTRIAXone (ROCEPHIN)  IV Stopped (04/20/19 2245)  . heparin 1,100 Units/hr (04/21/19 0905)     LOS: 1 day     Kayleen Memos, MD [facility] Pager  [phone]  If 7PM-7AM, please contact night-coverage [email or web address] Password [email or web address] 04/21/2019, 1:45 PM

## 2019-04-21 NOTE — Progress Notes (Signed)
ANTICOAGULATION CONSULT NOTE Pharmacy Consult for heparin Indication: history of VTE, xarelto on hold due to renal function  No Known Allergies  Patient Measurements: Height: 5' 4.02" (162.6 cm) Weight: 139 lb 1.8 oz (63.1 kg) IBW/kg (Calculated) : 54.74 Heparin Dosing Weight: n/a. Use TBW = 63 kg  Vital Signs: Temp: 98.8 F (37.1 C) (01/25 1403) Temp Source: Oral (01/25 1403) BP: 149/88 (01/25 1403) Pulse Rate: 97 (01/25 1403)  Labs: Recent Labs    04/20/19 1408 04/20/19 2212 04/21/19 0804 04/21/19 1655  HGB 13.2  --  11.3*  --   HCT 37.0  --  33.1*  --   PLT 323  --  239  --   APTT  --  23*  --   --   LABPROT  --  13.8  --   --   INR  --  1.1  --   --   HEPARINUNFRC  --  <0.10* 0.12* 0.37  CREATININE 5.43*  --  3.74*  --     Estimated Creatinine Clearance: 14 mL/min (A) (by C-G formula based on SCr of 3.74 mg/dL (H)).   Medical History: Past Medical History:  Diagnosis Date  . Depression   . H/O blood clots   . Hypertension   . Lupus (HCC)     Assessment: Pt admitted with AMS and AKI. Pt recently moved to [place] from [facility] and unable to provide much history given AMS. PMH significant for hx DVT on rivaroxaban PTA. Due to AKI and CrCl < 10 mL/min on admission, anticoagulation being transitioned to heparin drip.   Last xarelto dose: unknown  Today, 04/21/19   HL 0.37 therapeutic after 2000 unit bolus and rate increase to 1100 units/hr  CBC: Hgb 11.3 , Plt 239. WNL  SCr 3.74 , CrCl 14 mL/min  Baseline HL < 0.1 and aPTT 23 indicates non compliance with xarelto  No bleeding or other issues per RN  Goal of Therapy:  Heparin level 0.3-0.7 units/ml Monitor platelets by anticoagulation protocol: Yes   Plan:   continue heparin drip at1100 units/hr  CBC and HL daily  Monitor for signs/symptoms of bleeding  Follow along for transition back to rivaroxaban once renal function improves  Herby Abraham, Pharm.D [phone] 04/21/2019 6:19 PM

## 2019-04-21 NOTE — Progress Notes (Addendum)
ANTICOAGULATION CONSULT NOTE - Initial Consult  Pharmacy Consult for heparin Indication: history of VTE, xarelto on hold due to renal function  No Known Allergies  Patient Measurements: Height: 5' 4.02" (162.6 cm) Weight: 139 lb 1.8 oz (63.1 kg) IBW/kg (Calculated) : 54.74 Heparin Dosing Weight: n/a. Use TBW = 63 kg  Vital Signs: Temp: 98.8 F (37.1 C) (01/25 0542) Temp Source: Oral (01/25 0542) BP: 152/74 (01/25 0542) Pulse Rate: 96 (01/25 0542)  Labs: Recent Labs    04/20/19 1408 04/20/19 2212 04/21/19 0804  HGB 13.2  --  11.3*  HCT 37.0  --  33.1*  PLT 323  --  239  APTT  --  23*  --   LABPROT  --  13.8  --   INR  --  1.1  --   HEPARINUNFRC  --  <0.10* 0.12*  CREATININE 5.43*  --   --     Estimated Creatinine Clearance: 9.6 mL/min (A) (by C-G formula based on SCr of 5.43 mg/dL (H)).   Medical History: Past Medical History:  Diagnosis Date  . Depression   . H/O blood clots   . Hypertension   . Lupus (HCC)     Assessment: Pt admitted with AMS and AKI. Pt recently moved to [place] from [facility] and unable to provide much history given AMS. PMH significant for hx DVT on rivaroxaban PTA. Due to AKI and CrCl < 10 mL/min on admission, anticoagulation being transitioned to heparin drip.   Last xarelto dose: unknown  Today, 04/21/19   HL 0.12 subtherapeutic  CBC: Hgb 11.3 , Plt 239. WNL  SCr 3.74 , CrCl 14 mL/min  Baseline HL < 0.1 and aPTT 23 indicates non compliance with xarelto  No bleeding or other issues per RN  Goal of Therapy:  Heparin level 0.3-0.7 units/ml Monitor platelets by anticoagulation protocol: Yes   Plan:   Bolus heparin 2000 units x 1  Increase heparin drip to 1100 units/hr  Will use only HL going forward, check in 8 hours  CBC and HL daily  Monitor for signs/symptoms of bleeding  Follow along for transition back to rivaroxaban once renal function improves  Arley Phenix RPh 04/21/2019, 8:54 AM

## 2019-04-22 LAB — HEPATITIS PANEL, ACUTE
HCV Ab: NONREACTIVE
Hep A IgM: NONREACTIVE
Hep B C IgM: NONREACTIVE
Hepatitis B Surface Ag: NONREACTIVE

## 2019-04-22 LAB — CBC WITH DIFFERENTIAL/PLATELET
Abs Immature Granulocytes: 0.04 10*3/uL (ref 0.00–0.07)
Basophils Absolute: 0 10*3/uL (ref 0.0–0.1)
Basophils Relative: 0 %
Eosinophils Absolute: 0.1 10*3/uL (ref 0.0–0.5)
Eosinophils Relative: 2 %
HCT: 30.1 % — ABNORMAL LOW (ref 36.0–46.0)
Hemoglobin: 10.3 g/dL — ABNORMAL LOW (ref 12.0–15.0)
Immature Granulocytes: 1 %
Lymphocytes Relative: 11 %
Lymphs Abs: 0.6 10*3/uL — ABNORMAL LOW (ref 0.7–4.0)
MCH: 32 pg (ref 26.0–34.0)
MCHC: 34.2 g/dL (ref 30.0–36.0)
MCV: 93.5 fL (ref 80.0–100.0)
Monocytes Absolute: 0.5 10*3/uL (ref 0.1–1.0)
Monocytes Relative: 10 %
Neutro Abs: 4.3 10*3/uL (ref 1.7–7.7)
Neutrophils Relative %: 76 %
Platelets: 208 10*3/uL (ref 150–400)
RBC: 3.22 MIL/uL — ABNORMAL LOW (ref 3.87–5.11)
RDW: 12.7 % (ref 11.5–15.5)
WBC: 5.5 10*3/uL (ref 4.0–10.5)
nRBC: 0 % (ref 0.0–0.2)

## 2019-04-22 LAB — COMPREHENSIVE METABOLIC PANEL
ALT: 48 U/L — ABNORMAL HIGH (ref 0–44)
AST: 66 U/L — ABNORMAL HIGH (ref 15–41)
Albumin: 3.4 g/dL — ABNORMAL LOW (ref 3.5–5.0)
Alkaline Phosphatase: 57 U/L (ref 38–126)
Anion gap: 9 (ref 5–15)
BUN: 43 mg/dL — ABNORMAL HIGH (ref 6–20)
CO2: 35 mmol/L — ABNORMAL HIGH (ref 22–32)
Calcium: 8.8 mg/dL — ABNORMAL LOW (ref 8.9–10.3)
Chloride: 84 mmol/L — ABNORMAL LOW (ref 98–111)
Creatinine, Ser: 2.07 mg/dL — ABNORMAL HIGH (ref 0.44–1.00)
GFR calc Af Amer: 30 mL/min — ABNORMAL LOW (ref >60)
GFR calc non Af Amer: 26 mL/min — ABNORMAL LOW (ref >60)
Glucose, Bld: 100 mg/dL — ABNORMAL HIGH (ref 70–99)
Potassium: 2.8 mmol/L — ABNORMAL LOW (ref 3.5–5.1)
Sodium: 128 mmol/L — ABNORMAL LOW (ref 135–145)
Total Bilirubin: 1.3 mg/dL — ABNORMAL HIGH (ref 0.3–1.2)
Total Protein: 6.1 g/dL — ABNORMAL LOW (ref 6.5–8.1)

## 2019-04-22 LAB — URINE CULTURE: Culture: NO GROWTH

## 2019-04-22 LAB — HEPARIN LEVEL (UNFRACTIONATED): Heparin Unfractionated: 0.3 IU/mL (ref 0.30–0.70)

## 2019-04-22 MED ORDER — MAGNESIUM SULFATE 2 GM/50ML IV SOLN
2.0000 g | Freq: Once | INTRAVENOUS | Status: AC
  Administered 2019-04-22: 15:00:00 2 g via INTRAVENOUS
  Filled 2019-04-22: qty 50

## 2019-04-22 MED ORDER — LEVALBUTEROL HCL 0.63 MG/3ML IN NEBU: 0.6300 mg | INHALATION_SOLUTION | Freq: Four times a day (QID) | RESPIRATORY_TRACT | Status: DC | PRN

## 2019-04-22 MED ORDER — POTASSIUM CHLORIDE CRYS ER 20 MEQ PO TBCR
40.0000 meq | EXTENDED_RELEASE_TABLET | Freq: Three times a day (TID) | ORAL | Status: AC
  Administered 2019-04-22 – 2019-04-23 (×3): 40 meq via ORAL
  Filled 2019-04-22 (×3): qty 2

## 2019-04-22 MED ORDER — APIXABAN 5 MG PO TABS
5.0000 mg | ORAL_TABLET | Freq: Two times a day (BID) | ORAL | Status: DC
  Administered 2019-04-22 – 2019-04-24 (×5): 5 mg via ORAL
  Filled 2019-04-22: qty 1
  Filled 2019-04-22: qty 2
  Filled 2019-04-22: qty 1
  Filled 2019-04-22: qty 2
  Filled 2019-04-22: qty 1

## 2019-04-22 MED ORDER — SODIUM CHLORIDE 0.9 % IV SOLN: INTRAVENOUS | Status: DC

## 2019-04-22 MED ORDER — POTASSIUM CHLORIDE IN NACL 20-0.9 MEQ/L-% IV SOLN: INTRAVENOUS | Status: DC

## 2019-04-22 NOTE — Progress Notes (Signed)
Pharmacy: heparin --> Eliquis  Patient's a  59 y.o F with hx DVT on xarelto PTA, presented to the ED on 1/24 with AMS.  She was found to be in AKI with anticoag. changed to heparin drip on admission.  Pharmacy was consulted on 1/26 to transition pt to eliquis.  Today, 04/22/2019: - scr elevated but trending down (2.07, crcl~25) - cbc somewhat stable - no bleeding documented  Plan: - d/c heparin drip - start Eliquis 5 mg bid - monitor for s/s bleeding and renal function  Kathleen Gomez, PharmD, BCPS 04/22/2019 2:11 PM

## 2019-04-22 NOTE — Progress Notes (Signed)
Physical Therapy Treatment Patient Details Name: Kathleen Gomez MRN: [id number] DOB: [birth year] Today's Date: 04/22/2019    History of Present Illness Kathleen Gomez is a 59 y.o. female with medical history significant for HTN, DVT on Xarelto, lupus, THC abuse, tobacco use disorder, COPD, chronic anxiety and depression, migraine, HLD, previous appendectomy and cholecystectomy. Patient presented to [facility] ED from home via EMS with altered mental status. Patient moved to [place] about a month and half ago from [place] to be with her father who is terminally ill with bladder cancer and severe COPD. Patient was brought into the ED for further evaluation of her altered mental status. At baseline she has a normal mentation.    PT Comments    Pt remains confused today but pleasant and agreeable to participate in therapy. Pt progressed gait distance with use of IV pole, no overt LOB while ambulating. Cues required for safety and for awareness with use of IV pole to steady. Pt remains limited by confusion. Acute PT will continue to progress as able towards PLOF.   Follow Up Recommendations  Supervision for mobility/OOB(SNF vs. HHPT pending progress with cognition)     Equipment Recommendations  Other (comment)(TBD pending progress)    Recommendations for Other Services       Precautions / Restrictions Precautions Precautions: Fall Restrictions Weight Bearing Restrictions: No    Mobility  Bed Mobility Overal bed mobility: Needs Assistance Bed Mobility: Supine to Sit;Sit to Supine     Supine to sit: Modified independent (Device/Increase time);Supervision Sit to supine: Modified independent (Device/Increase time);Supervision   General bed mobility comments: supervision for safety  Transfers Overall transfer level: Needs assistance Equipment used: None Transfers: Sit to/from Stand Sit to Stand: Min guard         General transfer comment: min guard for safety to complete sit<>stand from EOB and  toilet  Ambulation/Gait Ambulation/Gait assistance: Min guard Gait Distance (Feet): 300 Feet Assistive device: Rolling walker (2 wheeled) Gait Pattern/deviations: Step-through pattern;Decreased stride length;Narrow base of support Gait velocity: decreased   General Gait Details: cues for safe mangement of IV pole to steady with gait. Pt remained on RA this date and SpO2 remained from 95-98% throughout gait. No overt LOB noted.   Stairs             Wheelchair Mobility    Modified Rankin (Stroke Patients Only)       Balance Overall balance assessment: Needs assistance Sitting-balance support: Feet supported Sitting balance-Leahy Scale: Good     Standing balance support: During functional activity;Bilateral upper extremity supported Standing balance-Leahy Scale: Fair           Cognition Arousal/Alertness: Awake/alert Behavior During Therapy: WFL for tasks assessed/performed Overall Cognitive Status: Impaired/Different from baseline Area of Impairment: Orientation;Memory        Orientation Level: Disoriented to;Person;Place;Time;Situation   Memory: Decreased short-term memory         General Comments: pt remains pleasantly confused and agreeable to particiapte in therapy. pt able to follow simple and complex commands with extra time. pt able to recall where she worked and that she came here to help her father. She was unable to state current month even with cues.      Exercises Other Exercises Other Exercises: 10x Sit<>Stand. cues to deter UE use to facilitate LE strengthening. cues for counting, pt able to complete withot mistakes from 5th -10th rep.    General Comments        Pertinent Vitals/Pain Pain Assessment: No/denies pain  PT Goals (current goals can now be found in the care plan section) Acute Rehab PT Goals PT Goal Formulation: Patient unable to participate in goal setting Time For Goal Achievement: 05/05/19 Potential to  Achieve Goals: Good Progress towards PT goals: Progressing toward goals    Frequency    Min 3X/week      PT Plan Current plan remains appropriate       AM-PAC PT "6 Clicks" Mobility   Outcome Measure  Help needed turning from your back to your side while in a flat bed without using bedrails?: None Help needed moving from lying on your back to sitting on the side of a flat bed without using bedrails?: None Help needed moving to and from a bed to a chair (including a wheelchair)?: A Little Help needed standing up from a chair using your arms (e.g., wheelchair or bedside chair)?: A Little Help needed to walk in hospital room?: A Little Help needed climbing 3-5 steps with a railing? : A Little 6 Click Score: 20    End of Session Equipment Utilized During Treatment: Gait belt Activity Tolerance: Patient tolerated treatment well Patient left: in chair;with call bell/phone within reach;with chair alarm set Nurse Communication: Mobility status PT Visit Diagnosis: Other abnormalities of gait and mobility (R26.89);Difficulty in walking, not elsewhere classified (R26.2)     Time: 5409-8119 PT Time Calculation (min) (ACUTE ONLY): 28 min  Charges:  $Gait Training: 8-22 mins $Therapeutic Activity: 8-22 mins                     Wynn Maudlin, DPT Physical Therapist with [facility] [phone]  04/22/2019 2:45 PM

## 2019-04-22 NOTE — Progress Notes (Addendum)
PROGRESS NOTE  Kathleen Gomez UTM:[id number] DOB: 01/01/1961 DOA: 04/20/2019 PCP: System, Pcp Not In  HPI/Recap of past 24 hours: Kathleen Gomez is a 59 y.o. female with medical history significant for HTN, DVT on Xarelto, lupus, THC abuse, tobacco use disorder, COPD, chronic anxiety and depression, migraine, HLD, previous appendectomy and cholecystectomy who presented to [facility] ED from home via EMS with altered mental status.  Patient is in the room alone with no family members present.  History mainly obtained from chart review, her father, and from EDP.  Patient moved to [place] about a month and half ago from [place] to be with her father who is terminally ill with bladder cancer and severe COPD.  Patient's father reports that she has been having nausea and vomiting for the last 3 to 4 days.  Associated with poor oral intake.  Yesterday she became incoherent.  Her inconherence worsened this morning so her father called EMS.  Patient was brought into the ED for further evaluation of her altered mental status.  She arrived in the ED with feces down her legs.  At baseline she has a normal mentation. No personal or family history of seizures.  No recent known alcohol use.  ED Course: CT head nonacute.  COVID-19 screening test negative.  UA with urine WBC between 11-20, will obtain urine culture.  Leukocytosis with WBC 12.3.  Elevated creatinine up to 5.43 with baseline creatinine of 0.91.  Potassium of 2.1.  Magnesium of 1.7.  TRH was asked to admit.  04/22/19: Seen and examined.  No acute events overnight.  Still confused.  She is alert oriented x1.  Has significant electrolyte abnormalities, being repleted.   Assessment/Plan: Active Problems:   AKI (acute kidney injury) (HCC)  AKI, likely prerenal in the setting of dehydration from poor oral intake, nausea and vomiting. Presented with creatinine of 5.43 with GFR of 8 Baseline creatinine of 0.91 with GFR greater than 60 (02/25/19) Creatinine continues to  trend down to 2 with GFR of 26. Continue to avoid nephrotoxins and hypotension. Continue to monitor urine output Continue daily BMPs  Acute metabolic encephalopathy, unclear etiology, likely secondary to dehydration versus others CT head non acute One-to-one in place for patient's own safety Presented significantly dehydrated with multiple electrolyte abnormalities. Continue IV fluid hydration and continue to aggressively replace electrolytes.  Resolved intractable nausea and vomiting suspect cannabinoid hyperemesis syndrome No recurrent nausea and vomiting while in the hospital Positive THC on admission UDS. Continue to treat symptomatically Avoid QTC prolonging agents, may use Compazine  Ruled out UTI Presented with urine analysis with urine WBC between 11 and 20 and serum WBC 12.3K Due to unexplained altered mental status treated empirically with Rocephin for presumed UTI 04/20/19--04/22/19 Ucx no growth final.  DC Rocephin.  Refractory severe hypokalemia Presented with potassium of 2.1>> 2.8 Continue IV potassium infusion Added p.o. KCl 40 mEq x 3 doses. Repeat BMP in the morning Added 2 g IV magnesium Obtain magnesium level in the morning  Elevated AST ALT Unclear etiology Continues to trend down. Acute hepatitis panel negative  Hyperbilirubinemia Presented with bilirubin 2.7>>1.9>>1.3 Unclear etiology CT abdomen pelvis without contrast unrevealing. T bili improving and trending down.  Hypomagnesemia Magnesium 1.7 Repleted with 2 g IV magnesium once.  Resolving sinus tachycardia Suspect in the setting of dehydration Continue IV fluid Continue to monitor on telemetry  Prolonged QTC Avoid QTC prolonging agents Repeat twelve-lead EKG in the morning  Essential hypertension Blood pressure is not at goal Continue home medications  History of DVT Currently on heparin drip, switch to Eliquis Home Xarelto was stopped due to poor renal function.    Chronic anxiety/depression Continue home medications  Hyperlipidemia Continue to hold statin due to elevated AST ALT  History of migraines Continue home medications  COPD Continue home medications  Marijuana use disorder/tobacco use disorder Provide counseling when no longer altered.   DVT prophylaxis:  Eliquis  Code Status: Full code per the patient herself.  Family Communication: Discussed plan of care with the patient's father.  All questions answered.  Disposition Plan:  Patient is from home.  Anticipate discharge to home likely in the next 48 hours.  Barriers to discharge ongoing treatment for AKI with IV fluid and repletion of electrolytes intravenously.  Altered not back to baseline.  Consults called: None    Objective: Vitals:   04/21/19 2127 04/22/19 0456 04/22/19 0804 04/22/19 0946  BP:  (!) 162/86  (!) 152/82  Pulse:  85  85  Resp:  16  18  Temp:  98.6 F (37 C)  98.1 F (36.7 C)  TempSrc:  Oral  Oral  SpO2: (!) 88% 92% 92% 97%  Weight:      Height:        Intake/Output Summary (Last 24 hours) at 04/22/2019 1353 Last data filed at 04/22/2019 0900 Gross per 24 hour  Intake 2336.46 ml  Output --  Net 2336.46 ml   Filed Weights   04/20/19 1951  Weight: 63.1 kg    Exam:  . General: 58 y.o. year-old female well-developed well-nourished no acute distress.  Alert and oriented x1.  Still confused.   . Cardiovascular: Regular rate and rhythm no rubs or gallops.   Marland Kitchen Respiratory: Clear to station no wheezes or rales. . Abdomen: Soft nontender bowel sounds present. . Musculoskeletal: No lower extremity edema bilaterally. Marland Kitchen Psychiatry: Unable to assess mood due to altered mental status.   Data Reviewed: CBC: Recent Labs  Lab 04/20/19 1408 04/21/19 0804 04/22/19 0451  WBC 12.3* 8.6 5.5  NEUTROABS 10.4* 7.3 4.3  HGB 13.2 11.3* 10.3*  HCT 37.0 33.1* 30.1*  MCV 90.9 93.8 93.5  PLT 323 239 208   Basic Metabolic Panel: Recent Labs   Lab 04/20/19 1408 04/20/19 1423 04/20/19 2134 04/21/19 0804 04/21/19 1444 04/22/19 0451  NA 131*  --   --  131*  --  128*  K 2.1*  --  2.0* 2.8* 3.0* 2.8*  CL <65*  --   --  71*  --  84*  CO2 48*  --   --  42*  --  35*  GLUCOSE 118*  --   --  98  --  100*  BUN 57*  --   --  56*  --  43*  CREATININE 5.43*  --   --  3.74*  --  2.07*  CALCIUM 9.1  --   --  8.9  --  8.8*  MG  --  1.7  --   --   --   --    GFR: Estimated Creatinine Clearance: 25.3 mL/min (A) (by C-G formula based on SCr of 2.07 mg/dL (H)). Liver Function Tests: Recent Labs  Lab 04/20/19 1408 04/21/19 0804 04/22/19 0451  AST 148* 100* 66*  ALT 57* 52* 48*  ALKPHOS 78 67 57  BILITOT 2.7* 1.9* 1.3*  PROT 8.1 6.8 6.1*  ALBUMIN 4.7 3.7 3.4*   No results for input(s): LIPASE, AMYLASE in the last 168 hours. No results for input(s): AMMONIA in the  last 168 hours. Coagulation Profile: Recent Labs  Lab 04/20/19 2212  INR 1.1   Cardiac Enzymes: No results for input(s): CKTOTAL, CKMB, CKMBINDEX, TROPONINI in the last 168 hours. BNP (last 3 results) No results for input(s): PROBNP in the last 8760 hours. HbA1C: No results for input(s): HGBA1C in the last 72 hours. CBG: Recent Labs  Lab 04/20/19 1356  GLUCAP 133*   Lipid Profile: No results for input(s): CHOL, HDL, LDLCALC, TRIG, CHOLHDL, LDLDIRECT in the last 72 hours. Thyroid Function Tests: No results for input(s): TSH, T4TOTAL, FREET4, T3FREE, THYROIDAB in the last 72 hours. Anemia Panel: No results for input(s): VITAMINB12, FOLATE, FERRITIN, TIBC, IRON, RETICCTPCT in the last 72 hours. Urine analysis:    Component Value Date/Time   COLORURINE AMBER (A) 04/20/2019 1443   APPEARANCEUR CLOUDY (A) 04/20/2019 1443   LABSPEC 1.016 04/20/2019 1443   PHURINE 6.0 04/20/2019 1443   GLUCOSEU 50 (A) 04/20/2019 1443   HGBUR LARGE (A) 04/20/2019 1443   BILIRUBINUR NEGATIVE 04/20/2019 1443   KETONESUR NEGATIVE 04/20/2019 1443   PROTEINUR 100 (A) 04/20/2019  1443   NITRITE NEGATIVE 04/20/2019 1443   LEUKOCYTESUR NEGATIVE 04/20/2019 1443   Sepsis Labs: @LABRCNTIP (procalcitonin:4,lacticidven:4)  ) Recent Results (from the past 240 hour(s))  Respiratory Panel by RT PCR (Flu A&B, Covid) - Urine, Clean Catch     Status: None   Collection Time: 04/20/19  2:43 PM   Specimen: Urine, Clean Catch  Result Value Ref Range Status   SARS Coronavirus 2 by RT PCR NEGATIVE NEGATIVE Final    Comment: (NOTE) SARS-CoV-2 target nucleic acids are NOT DETECTED. The SARS-CoV-2 RNA is generally detectable in upper respiratoy specimens during the acute phase of infection. The lowest concentration of SARS-CoV-2 viral copies this assay can detect is 131 copies/mL. A negative result does not preclude SARS-Cov-2 infection and should not be used as the sole basis for treatment or other patient management decisions. A negative result may occur with  improper specimen collection/handling, submission of specimen other than nasopharyngeal swab, presence of viral mutation(s) within the areas targeted by this assay, and inadequate number of viral copies (<131 copies/mL). A negative result must be combined with clinical observations, patient history, and epidemiological information. The expected result is Negative. Fact Sheet for Patients:  PinkCheek.be Fact Sheet for Healthcare Providers:  GravelBags.it This test is not yet ap proved or cleared by the [place] FDA and  has been authorized for detection and/or diagnosis of SARS-CoV-2 by FDA under an Emergency Use Authorization (EUA). This EUA will remain  in effect (meaning this test can be used) for the duration of the COVID-19 declaration under Section 564(b)(1) of the Act, 21 U.S.C. section 360bbb-3(b)(1), unless the authorization is terminated or revoked sooner.    Influenza A by PCR NEGATIVE NEGATIVE Final   Influenza B by PCR NEGATIVE NEGATIVE Final     Comment: (NOTE) The Xpert Xpress SARS-CoV-2/FLU/RSV assay is intended as an aid in  the diagnosis of influenza from Nasopharyngeal swab specimens and  should not be used as a sole basis for treatment. Nasal washings and  aspirates are unacceptable for Xpert Xpress SARS-CoV-2/FLU/RSV  testing. Fact Sheet for Patients: PinkCheek.be Fact Sheet for Healthcare Providers: GravelBags.it This test is not yet approved or cleared by the [place] FDA and  has been authorized for detection and/or diagnosis of SARS-CoV-2 by  FDA under an Emergency Use Authorization (EUA). This EUA will remain  in effect (meaning this test can be used) for the duration of the  Covid-19 declaration under Section 564(b)(1) of the Act, 21  U.S.C. section 360bbb-3(b)(1), unless the authorization is  terminated or revoked. Performed at [facility], [address], [place], [place] [address]   Urine culture     Status: None   Collection Time: 04/20/19  2:43 PM   Specimen: Urine, Random  Result Value Ref Range Status   Specimen Description   Final    URINE, RANDOM Performed at [facility], [address], [place], [place] [address]    Special Requests   Final    NONE Performed at [facility], [address], [place], [place] [address]    Culture   Final    NO GROWTH Performed at [facility], [address] [place], [place] [address]    Report Status 04/22/2019 FINAL  Final      Studies: No results found.  Scheduled Meds: . cloNIDine  0.1 mg Oral BID  . levalbuterol  0.63 mg Nebulization BID  . mometasone-formoterol  2 puff Inhalation BID  . potassium chloride  40 mEq Oral TID  . pravastatin  10 mg Oral QHS  . topiramate  25 mg Oral QHS  . umeclidinium bromide  1 puff Inhalation Daily    Continuous Infusions: . 0.9 % NaCl with KCl 20 mEq / L 75 mL/hr at 04/22/19 0600   . cefTRIAXone (ROCEPHIN)  IV Stopped (04/21/19 2219)  . heparin 1,200 Units/hr (04/22/19 0826)     LOS: 2 days     Darlin Drop, MD [facility] Pager [phone]  If 7PM-7AM, please contact night-coverage [email or web address] Password [email or web address] 04/22/2019, 1:53 PM

## 2019-04-22 NOTE — Discharge Instructions (Signed)

## 2019-04-22 NOTE — TOC Initial Note (Signed)
Transition of Care [facility]) - Initial/Assessment Note    Patient Details  Name: Kathleen Gomez MRN: [id number] Date of Birth: 11-10-1960  [facility]) CM/SW Contact:    Ida Rogue, LCSW Phone Number: 04/22/2019, 9:53 AM  Clinical Narrative:  Stopped in to see patient with AMS.  Long term memory is intact.  "I came her from [place] to stay with my father who needs help."  Knows she is in hospital, unsure why she is here.  Does not know the town. Says year is 2021, but when asked month states "2021."   TOC will continue to follow during the course of hospitalization.                 Expected Discharge Plan: (HH vs SNF) Barriers to Discharge: Other (comment)(Dispositional plan not yet identified; patient with AMS)   Patient Goals and CMS Choice Patient states their goals for this hospitalization and ongoing recovery are:: "I don't know why I am still here."      Expected Discharge Plan and Services Expected Discharge Plan: (HH vs SNF)   Discharge Planning Services: CM Consult   Living arrangements for the past 2 months: Single Family Home                                      Prior Living Arrangements/Services Living arrangements for the past 2 months: Single Family Home Lives with:: Parents Patient language and need for interpreter reviewed:: Yes Do you feel safe going back to the place where you live?: Yes      Need for Family Participation in Patient Care: Yes (Comment) Care giver support system in place?: Yes (comment)   Criminal Activity/Legal Involvement Pertinent to Current Situation/Hospitalization: No - Comment as needed  Activities of Daily Living Home Assistive Devices/Equipment: None ADL Screening (condition at time of admission) Patient's cognitive ability adequate to safely complete daily activities?: Yes Is the patient deaf or have difficulty hearing?: No Does the patient have difficulty seeing, even when wearing glasses/contacts?: No Does  the patient have difficulty concentrating, remembering, or making decisions?: Yes Patient able to express need for assistance with ADLs?: Yes Does the patient have difficulty dressing or bathing?: No Independently performs ADLs?: Yes (appropriate for developmental age) Does the patient have difficulty walking or climbing stairs?: No Weakness of Legs: None Weakness of Arms/Hands: None  Permission Sought/Granted                  Emotional Assessment Appearance:: Appears stated age Attitude/Demeanor/Rapport: Engaged Affect (typically observed): Constricted Orientation: : Oriented to Self Alcohol / Substance Use: Not Applicable Psych Involvement: No (comment)  Admission diagnosis:  AKI (acute kidney injury) (HCC) [N17.9] Acute renal failure, unspecified acute renal failure type (HCC) [N17.9] Acute metabolic encephalopathy [G93.41] Patient Active Problem List   Diagnosis Date Noted  . AKI (acute kidney injury) (HCC) 04/20/2019  . Intractable nausea and vomiting 02/24/2019  . Nausea & vomiting 02/23/2019  . Hypokalemia 02/23/2019  . DVT (deep venous thrombosis) (HCC) 02/23/2019  . Anxiety 02/23/2019  . Lupus (HCC) 02/23/2019  . Migraine 02/23/2019   PCP:  System, Pcp Not In Pharmacy:   [id number] [place], [place] - [address] AT [facility] OF [address] [place] [address] Phone: [phone] Fax: [phone]  [facility] - [place], [place] - [address] [place]  [place] [address] Phone: [phone] Fax: [phone]     Social Determinants of Health (SDOH) Interventions    Readmission Risk Interventions No flowsheet data found.

## 2019-04-22 NOTE — Progress Notes (Signed)
ANTICOAGULATION CONSULT NOTE Pharmacy Consult for heparin Indication: history of VTE, xarelto on hold due to renal function  No Known Allergies  Patient Measurements: Height: 5' 4.02" (162.6 cm) Weight: 139 lb 1.8 oz (63.1 kg) IBW/kg (Calculated) : 54.74 Heparin Dosing Weight: n/a. Use TBW = 63 kg  Vital Signs: Temp: 98.6 F (37 C) (01/26 0456) Temp Source: Oral (01/26 0456) BP: 162/86 (01/26 0456) Pulse Rate: 85 (01/26 0456)  Labs: Recent Labs     0000 04/20/19 1408 04/20/19 2212 04/20/19 2212 04/21/19 0804 04/21/19 1655 04/22/19 0451  HGB   < > 13.2  --   --  11.3*  --  10.3*  HCT  --  37.0  --   --  33.1*  --  30.1*  PLT  --  323  --   --  239  --  208  APTT  --   --  23*  --   --   --   --   LABPROT  --   --  13.8  --   --   --   --   INR  --   --  1.1  --   --   --   --   HEPARINUNFRC  --   --  <0.10*   < > 0.12* 0.37 0.30  CREATININE  --  5.43*  --   --  3.74*  --  2.07*   < > = values in this interval not displayed.    Estimated Creatinine Clearance: 25.3 mL/min (A) (by C-G formula based on SCr of 2.07 mg/dL (H)).   Medical History: Past Medical History:  Diagnosis Date  . Depression   . H/O blood clots   . Hypertension   . Lupus (HCC)     Assessment: Pt admitted with AMS and AKI. Pt recently moved to [place] from [facility] and unable to provide much history given AMS. PMH significant for hx DVT on rivaroxaban PTA. Due to AKI and CrCl < 10 mL/min on admission, anticoagulation being transitioned to heparin drip.   Last xarelto dose: unknown  Today, 04/22/19   HL 0.3, therapeutic but very low end on 1100 units/hr  CBC: Hgb 10.3, Plt 208. WNL  SCr 2.07 , CrCl 25.3 mL/min  Baseline HL < 0.1 and aPTT 23 indicates non compliance with xarelto  No bleeding or other issues per RN  Goal of Therapy:  Heparin level 0.3-0.7 units/ml Monitor platelets by anticoagulation protocol: Yes   Plan:   increase heparin drip to 1200 units/hr  CBC and HL  daily  Monitor for signs/symptoms of bleeding  Follow along for transition back to rivaroxaban once renal function improves  Arley Phenix RPh 04/22/2019, 7:50 AM

## 2019-04-23 LAB — CBC WITH DIFFERENTIAL/PLATELET
Abs Immature Granulocytes: 0.05 10*3/uL (ref 0.00–0.07)
Basophils Absolute: 0 10*3/uL (ref 0.0–0.1)
Basophils Relative: 0 %
Eosinophils Absolute: 0.3 10*3/uL (ref 0.0–0.5)
Eosinophils Relative: 5 %
HCT: 28.5 % — ABNORMAL LOW (ref 36.0–46.0)
Hemoglobin: 10.1 g/dL — ABNORMAL LOW (ref 12.0–15.0)
Immature Granulocytes: 1 %
Lymphocytes Relative: 10 %
Lymphs Abs: 0.5 10*3/uL — ABNORMAL LOW (ref 0.7–4.0)
MCH: 32.5 pg (ref 26.0–34.0)
MCHC: 35.4 g/dL (ref 30.0–36.0)
MCV: 91.6 fL (ref 80.0–100.0)
Monocytes Absolute: 0.5 10*3/uL (ref 0.1–1.0)
Monocytes Relative: 10 %
Neutro Abs: 3.8 10*3/uL (ref 1.7–7.7)
Neutrophils Relative %: 74 %
Platelets: 215 10*3/uL (ref 150–400)
RBC: 3.11 MIL/uL — ABNORMAL LOW (ref 3.87–5.11)
RDW: 12.5 % (ref 11.5–15.5)
WBC: 5.1 10*3/uL (ref 4.0–10.5)
nRBC: 0 % (ref 0.0–0.2)

## 2019-04-23 LAB — COMPREHENSIVE METABOLIC PANEL
ALT: 42 U/L (ref 0–44)
AST: 41 U/L (ref 15–41)
Albumin: 3.3 g/dL — ABNORMAL LOW (ref 3.5–5.0)
Alkaline Phosphatase: 55 U/L (ref 38–126)
Anion gap: 9 (ref 5–15)
BUN: 28 mg/dL — ABNORMAL HIGH (ref 6–20)
CO2: 24 mmol/L (ref 22–32)
Calcium: 8.6 mg/dL — ABNORMAL LOW (ref 8.9–10.3)
Chloride: 95 mmol/L — ABNORMAL LOW (ref 98–111)
Creatinine, Ser: 1.45 mg/dL — ABNORMAL HIGH (ref 0.44–1.00)
GFR calc Af Amer: 46 mL/min — ABNORMAL LOW (ref >60)
GFR calc non Af Amer: 39 mL/min — ABNORMAL LOW (ref >60)
Glucose, Bld: 87 mg/dL (ref 70–99)
Potassium: 3.3 mmol/L — ABNORMAL LOW (ref 3.5–5.1)
Sodium: 128 mmol/L — ABNORMAL LOW (ref 135–145)
Total Bilirubin: 1.2 mg/dL (ref 0.3–1.2)
Total Protein: 6 g/dL — ABNORMAL LOW (ref 6.5–8.1)

## 2019-04-23 LAB — MAGNESIUM: Magnesium: 2.2 mg/dL (ref 1.7–2.4)

## 2019-04-23 MED ORDER — POTASSIUM CHLORIDE CRYS ER 20 MEQ PO TBCR
40.0000 meq | EXTENDED_RELEASE_TABLET | Freq: Once | ORAL | Status: AC
  Administered 2019-04-23: 15:00:00 40 meq via ORAL
  Filled 2019-04-23: qty 2

## 2019-04-23 NOTE — TOC Progression Note (Signed)
Transition of Care [facility]) - Progression Note    Patient Details  Name: Kathleen Gomez MRN: [id number] Date of Birth: May 11, 1960  Transition of Care [facility]) CM/SW Contact  Ida Rogue, [place] Phone Number: 04/23/2019, 2:34 PM  Clinical Narrative:  Investigation of a benefits check reveals patient's insurance has lapsed.  Father knows nothing about this.  When questioned about the circumstances of her coming here, he states he thinks his son Aggie Hacker, [phone], who lives in [place] talked to patient about coming to care for father.  "I'm not doing that well.  I have lots of medical issues and am probably not going to be around for a long time."  He knows little about patient's life prior to coming here.  "I think she was in [facility] for about 4 years-she was with someone early on but was by herself at the end."   Left message for patient's brother. TOC will continue to follow during the course of hospitalization.     Expected Discharge Plan: (HH vs SNF) Barriers to Discharge: Other (comment)(Dispositional plan not yet identified; patient with AMS)  Expected Discharge Plan and Services Expected Discharge Plan: (HH vs SNF)   Discharge Planning Services: CM Consult   Living arrangements for the past 2 months: Single Family Home                                       Social Determinants of Health (SDOH) Interventions    Readmission Risk Interventions No flowsheet data found.

## 2019-04-23 NOTE — TOC Benefit Eligibility Note (Signed)
Transition of Care [facility]) Benefit Eligibility Note    Patient Details  Name: Shian Goodnow MRN: [id number] Date of Birth: Dec 13, 1960   Medication/Dose: Eliquis  Covered?: No     Prescription Coverage Preferred Pharmacy: NA  Spoke with Person/Company/Phone Number:: [employer] [phone]  Co-Pay: NA  Prior Approval: (NA)  Deductible: (NA)  Additional Notes: Policy Lapsed    Caren Macadam Phone Number: 04/23/2019, 12:43 PM

## 2019-04-23 NOTE — Plan of Care (Signed)
Pt alert and oriented x 2 denies pain. Bed alarm on can be impulsive to get up to bathroom. Educated on call bell use return demonstration given but must be reinforced at times. Room free of clutter, fall resistant socks in place, bed I low position. No fall/injury thus far this shift. Med/diet compliant will continue to monitor

## 2019-04-23 NOTE — Care Management Important Message (Signed)
Important Message  Patient Details IM Letter given to Daryel Gerald SW Case Manager to present to the Patient Name: Kathleen Gomez MRN: [id number] Date of Birth: 11-28-1960   Medicare Important Message Given:  Yes     Caren Macadam 04/23/2019, 10:00 AM

## 2019-04-23 NOTE — Progress Notes (Signed)
PROGRESS NOTE    Kathleen Gomez  [id number]:[id number] DOB: October 23, 1960 DOA: 04/20/2019 PCP: System, Pcp Not In   Brief Narrative: 59 year old female with history of HTN, DVT on Xarelto, lupus, THC abuse, tobacco use, COPD, current anxiety/depression, migraine, HLD, previous cholecystectomy presented to ER via EMS with altered mental status.  Moved to [place] about a month ago from [place] to take care of her elderly father who is terminally ill with bladder cancer and severe COPD.  Patient's father reported patient having nausea vomiting for last 3 to 4 days, poor oral intake and 1 day prior to admission became incoherent that worsened and EMS was called.  In the ER CT head no acute finding, COVID-19 negative, UA abnormal with WBC 11-20, with leukocytosis significant acute renal failure electrolyte imbalance, patient was admitted for further management.  Subjective:  Seen and examined this morning.   Alert,awake, knows she is in hospital Answers "I dont know"  When asked date, location, current president- states she lives with her dad who is 46. No nausea/vomitting/abd pain. Smokes marijuana and cigarette " Every chance I get. I will smoke" Reports had BF. On RA  Assessment & Plan:  AKI suspect ATN due to severe volume depletion poor oral intake: BUN/creatinine significantly improving with IV fluid hydration.  Continue to monitor intake output, daily labs, avoid nephrotoxic medication.  CT abdomen - hiatal hernia small, kidneys normal without renal calculi focal lesion or hydronephrosis and bladder was unremarkable. Recent Labs  Lab 04/20/19 1408 04/21/19 0804 04/22/19 0451 04/23/19 0438  BUN 57* 56* 43* 28*  CREATININE 5.43* 3.74* 2.07* 1.45*   Acute metabolic encephalopathy: Suspect multifactorial due to AKI, severe dehydration.  CT head no acute finding, patient has became more coherent but is still confused.  Continue supportive care fall precaution.  Intractable nausea vomiting: resolved.  Suspect cannabinoid hyperemesis syndrome.  CT abdomen pelvis no acute finding.  UDS THC positive on admission.  At this time resolved tolerating diet.  Continue supportive care.  Hypokalemia severe and refractory: Potassium level is finally improving continue to replete.  k-l 3.3, will give 40 oral potassium chloride. monitor  Hyponatremia hypovolemic continue normal saline hydration.  Anemia likely from chronic disease: Hemoglobin is stable in 10 to 11 g  Abnormal UA likely from AKI, on admission also with leukocytosis urine culture no growth Rocephin discontinued.  UTI ruled out.  Abnormal LFTs-elevated AST ALT and total bili: Acute viral hepatitis panel negative.  Level trending down and normal today  Prolonged QTC: qtc 505, likely from electrolyte imbalance.  Monitor lites and repeat EKG in the morning.  Avoid QT prolonging medication.    Sinus tachycardia likely from dehydration resolved.  Essential hypertension: Blood pressure improving.  Continue her clonidine.  History of DVT on xarelto at home:on admission on heparin infusion and now subsequently switched to Eliquis.  Home Xarelto was discontinued due to poor renal function can consider switching to Xarelto on discharge  Anxiety/depression/history of migraine: Continue home medication.  HLD: Continue pravastatin.    COPD continue home inhalers.  Substance abuse marijuana abuse tobacco use disorder: Once mental status improves will provide counseling.  Body mass index is 23.87 kg/m.    DVT prophylaxis: eliquis Code Status: Full Family Communication: plan of care discussed with patient at bedside. Disposition Plan: Remains inpatient pending improvement in her mental status.  Continue PT OT.  Home health versus SNF  Consultants: NONE Procedures:see note Microbiology: Antimicrobials: Anti-infectives (From admission, onward)   Start     Dose/Rate  Route Frequency Ordered Stop   04/20/19 2100  cefTRIAXone (ROCEPHIN) 1 g  in sodium chloride 0.9 % 100 mL IVPB  Status:  Discontinued     1 g 200 mL/hr over 30 Minutes Intravenous Every 24 hours 04/20/19 1830 04/22/19 1357      Medications: Scheduled Meds: . apixaban  5 mg Oral BID  . cloNIDine  0.1 mg Oral BID  . mometasone-formoterol  2 puff Inhalation BID  . potassium chloride  40 mEq Oral TID  . pravastatin  10 mg Oral QHS  . topiramate  25 mg Oral QHS  . umeclidinium bromide  1 puff Inhalation Daily   Continuous Infusions: . sodium chloride 75 mL/hr at 04/22/19 2106    Objective: Vitals:   04/22/19 1515 04/22/19 2043 04/22/19 2052 04/23/19 0931  BP: (!) 167/93 (!) 139/94    Pulse: 83 83    Resp:  20    Temp:      TempSrc:      SpO2: 95% 97% 94% 98%  Weight:      Height:        Intake/Output Summary (Last 24 hours) at 04/23/2019 0950 Last data filed at 04/23/2019 0400 Gross per 24 hour  Intake 1679.84 ml  Output 300 ml  Net 1379.84 ml   Filed Weights   04/20/19 1951  Weight: 63.1 kg   Weight change:   Body mass index is 23.87 kg/m.  Intake/Output from previous day: 01/26 0701 - 01/27 0700 In: 2039.8 [P.O.:820; I.V.:1219.8] Out: 300 [Urine:300] Intake/Output this shift: No intake/output data recorded.  Examination:  General exam: AAOx1-2,NAD,foloows commands,Weak appearing. HEENT:Oral mucosa DRY, Ear/Nose WNL grossly, dentition normal. Respiratory system: b/ clear no  crackles,no use of accessory muscle Cardiovascular system: S1 & S2 +, No JVD,. Gastrointestinal system: Abdomen soft, NT,ND, BS+ Nervous System:Alert, awake, moving extremities and grossly nonfocal Extremities: No edema, distal peripheral pulses palpable.  Skin: No rashes,no icterus. MSK: Normal muscle bulk,tone, power   Data Reviewed: I have personally reviewed following labs and imaging studies  CBC: Recent Labs  Lab 04/20/19 1408 04/21/19 0804 04/22/19 0451 04/23/19 0438  WBC 12.3* 8.6 5.5 5.1  NEUTROABS 10.4* 7.3 4.3 3.8  HGB 13.2 11.3* 10.3*  10.1*  HCT 37.0 33.1* 30.1* 28.5*  MCV 90.9 93.8 93.5 91.6  PLT 323 239 208 132   Basic Metabolic Panel: Recent Labs  Lab 04/20/19 1408 04/20/19 1408 04/20/19 1423 04/20/19 2134 04/21/19 0804 04/21/19 1444 04/22/19 0451 04/23/19 0438  NA 131*  --   --   --  131*  --  128* 128*  K 2.1*   < >  --  2.0* 2.8* 3.0* 2.8* 3.3*  CL <65*  --   --   --  71*  --  84* 95*  CO2 48*  --   --   --  42*  --  35* 24  GLUCOSE 118*  --   --   --  98  --  100* 87  BUN 57*  --   --   --  56*  --  43* 28*  CREATININE 5.43*  --   --   --  3.74*  --  2.07* 1.45*  CALCIUM 9.1  --   --   --  8.9  --  8.8* 8.6*  MG  --   --  1.7  --   --   --   --  2.2   < > = values in this interval not displayed.   GFR:  Estimated Creatinine Clearance: 36.1 mL/min (A) (by C-G formula based on SCr of 1.45 mg/dL (H)). Liver Function Tests: Recent Labs  Lab 04/20/19 1408 04/21/19 0804 04/22/19 0451 04/23/19 0438  AST 148* 100* 66* 41  ALT 57* 52* 48* 42  ALKPHOS 78 67 57 55  BILITOT 2.7* 1.9* 1.3* 1.2  PROT 8.1 6.8 6.1* 6.0*  ALBUMIN 4.7 3.7 3.4* 3.3*   No results for input(s): LIPASE, AMYLASE in the last 168 hours. No results for input(s): AMMONIA in the last 168 hours. Coagulation Profile: Recent Labs  Lab 04/20/19 2212  INR 1.1   Cardiac Enzymes: No results for input(s): CKTOTAL, CKMB, CKMBINDEX, TROPONINI in the last 168 hours. BNP (last 3 results) No results for input(s): PROBNP in the last 8760 hours. HbA1C: No results for input(s): HGBA1C in the last 72 hours. CBG: Recent Labs  Lab 04/20/19 1356  GLUCAP 133*   Lipid Profile: No results for input(s): CHOL, HDL, LDLCALC, TRIG, CHOLHDL, LDLDIRECT in the last 72 hours. Thyroid Function Tests: No results for input(s): TSH, T4TOTAL, FREET4, T3FREE, THYROIDAB in the last 72 hours. Anemia Panel: No results for input(s): VITAMINB12, FOLATE, FERRITIN, TIBC, IRON, RETICCTPCT in the last 72 hours. Sepsis Labs: No results for input(s): PROCALCITON,  LATICACIDVEN in the last 168 hours.  Recent Results (from the past 240 hour(s))  Respiratory Panel by RT PCR (Flu A&B, Covid) - Urine, Clean Catch     Status: None   Collection Time: 04/20/19  2:43 PM   Specimen: Urine, Clean Catch  Result Value Ref Range Status   SARS Coronavirus 2 by RT PCR NEGATIVE NEGATIVE Final    Comment: (NOTE) SARS-CoV-2 target nucleic acids are NOT DETECTED. The SARS-CoV-2 RNA is generally detectable in upper respiratoy specimens during the acute phase of infection. The lowest concentration of SARS-CoV-2 viral copies this assay can detect is 131 copies/mL. A negative result does not preclude SARS-Cov-2 infection and should not be used as the sole basis for treatment or other patient management decisions. A negative result may occur with  improper specimen collection/handling, submission of specimen other than nasopharyngeal swab, presence of viral mutation(s) within the areas targeted by this assay, and inadequate number of viral copies (<131 copies/mL). A negative result must be combined with clinical observations, patient history, and epidemiological information. The expected result is Negative. Fact Sheet for Patients:  https://www.moore.com/ Fact Sheet for Healthcare Providers:  https://www.young.biz/ This test is not yet ap proved or cleared by the [place] FDA and  has been authorized for detection and/or diagnosis of SARS-CoV-2 by FDA under an Emergency Use Authorization (EUA). This EUA will remain  in effect (meaning this test can be used) for the duration of the COVID-19 declaration under Section 564(b)(1) of the Act, 21 U.S.C. section 360bbb-3(b)(1), unless the authorization is terminated or revoked sooner.    Influenza A by PCR NEGATIVE NEGATIVE Final   Influenza B by PCR NEGATIVE NEGATIVE Final    Comment: (NOTE) The Xpert Xpress SARS-CoV-2/FLU/RSV assay is intended as an aid in  the diagnosis of  influenza from Nasopharyngeal swab specimens and  should not be used as a sole basis for treatment. Nasal washings and  aspirates are unacceptable for Xpert Xpress SARS-CoV-2/FLU/RSV  testing. Fact Sheet for Patients: https://www.moore.com/ Fact Sheet for Healthcare Providers: https://www.young.biz/ This test is not yet approved or cleared by the [place] FDA and  has been authorized for detection and/or diagnosis of SARS-CoV-2 by  FDA under an Emergency Use Authorization (EUA). This EUA will  remain  in effect (meaning this test can be used) for the duration of the  Covid-19 declaration under Section 564(b)(1) of the Act, 21  U.S.C. section 360bbb-3(b)(1), unless the authorization is  terminated or revoked. Performed at [facility], [address], [place], [place] [address]   Urine culture     Status: None   Collection Time: 04/20/19  2:43 PM   Specimen: Urine, Random  Result Value Ref Range Status   Specimen Description   Final    URINE, RANDOM Performed at [facility], [address], [place], [place] [address]    Special Requests   Final    NONE Performed at [facility], [address] [place], [place] [address]    Culture   Final    NO GROWTH Performed at [facility], [address] [place], [place] [address]    Report Status 04/22/2019 FINAL  Final     Radiology Studies: No results found.    LOS: 3 days   Time spent: More than 50% of that time was spent in counseling and/or coordination of care.  Lanae Boast, MD [facility]  04/23/2019, 9:50 AM

## 2019-04-24 LAB — COMPREHENSIVE METABOLIC PANEL
ALT: 39 U/L (ref 0–44)
AST: 32 U/L (ref 15–41)
Albumin: 3.1 g/dL — ABNORMAL LOW (ref 3.5–5.0)
Alkaline Phosphatase: 52 U/L (ref 38–126)
Anion gap: 8 (ref 5–15)
BUN: 17 mg/dL (ref 6–20)
CO2: 21 mmol/L — ABNORMAL LOW (ref 22–32)
Calcium: 8.5 mg/dL — ABNORMAL LOW (ref 8.9–10.3)
Chloride: 99 mmol/L (ref 98–111)
Creatinine, Ser: 1.05 mg/dL — ABNORMAL HIGH (ref 0.44–1.00)
GFR calc Af Amer: >60 mL/min (ref >60)
GFR calc non Af Amer: 58 mL/min — ABNORMAL LOW (ref >60)
Glucose, Bld: 99 mg/dL (ref 70–99)
Potassium: 3.1 mmol/L — ABNORMAL LOW (ref 3.5–5.1)
Sodium: 128 mmol/L — ABNORMAL LOW (ref 135–145)
Total Bilirubin: 1.2 mg/dL (ref 0.3–1.2)
Total Protein: 5.9 g/dL — ABNORMAL LOW (ref 6.5–8.1)

## 2019-04-24 LAB — CBC WITH DIFFERENTIAL/PLATELET
Abs Immature Granulocytes: 0.08 10*3/uL — ABNORMAL HIGH (ref 0.00–0.07)
Basophils Absolute: 0 10*3/uL (ref 0.0–0.1)
Basophils Relative: 0 %
Eosinophils Absolute: 0.3 10*3/uL (ref 0.0–0.5)
Eosinophils Relative: 5 %
HCT: 28.9 % — ABNORMAL LOW (ref 36.0–46.0)
Hemoglobin: 10.5 g/dL — ABNORMAL LOW (ref 12.0–15.0)
Immature Granulocytes: 1 %
Lymphocytes Relative: 10 %
Lymphs Abs: 0.6 10*3/uL — ABNORMAL LOW (ref 0.7–4.0)
MCH: 32.7 pg (ref 26.0–34.0)
MCHC: 36.3 g/dL — ABNORMAL HIGH (ref 30.0–36.0)
MCV: 90 fL (ref 80.0–100.0)
Monocytes Absolute: 0.7 10*3/uL (ref 0.1–1.0)
Monocytes Relative: 11 %
Neutro Abs: 4.2 10*3/uL (ref 1.7–7.7)
Neutrophils Relative %: 73 %
Platelets: 214 10*3/uL (ref 150–400)
RBC: 3.21 MIL/uL — ABNORMAL LOW (ref 3.87–5.11)
RDW: 12.6 % (ref 11.5–15.5)
WBC: 5.9 10*3/uL (ref 4.0–10.5)
nRBC: 0 % (ref 0.0–0.2)

## 2019-04-24 LAB — VITAMIN B12: Vitamin B-12: 313 pg/mL (ref 180–914)

## 2019-04-24 MED ORDER — MAGIC MOUTHWASH W/LIDOCAINE
15.0000 mL | Freq: Four times a day (QID) | ORAL | Status: DC | PRN
  Filled 2019-04-24 (×2): qty 15

## 2019-04-24 MED ORDER — RIVAROXABAN 20 MG PO TABS
20.0000 mg | ORAL_TABLET | Freq: Every day | ORAL | Status: DC
  Administered 2019-04-24 – 2019-04-25 (×2): 20 mg via ORAL
  Filled 2019-04-24 (×2): qty 1

## 2019-04-24 MED ORDER — POTASSIUM CHLORIDE CRYS ER 20 MEQ PO TBCR
40.0000 meq | EXTENDED_RELEASE_TABLET | ORAL | Status: AC
  Administered 2019-04-24 (×2): 40 meq via ORAL
  Filled 2019-04-24 (×2): qty 2

## 2019-04-24 MED ORDER — PHENOL 1.4 % MT LIQD
1.0000 | OROMUCOSAL | Status: DC | PRN
  Administered 2019-04-24: 22:00:00 1 via OROMUCOSAL
  Filled 2019-04-24: qty 177

## 2019-04-24 MED ORDER — AMLODIPINE BESYLATE 5 MG PO TABS
5.0000 mg | ORAL_TABLET | Freq: Every day | ORAL | Status: DC
  Administered 2019-04-24 – 2019-04-26 (×3): 5 mg via ORAL
  Filled 2019-04-24 (×4): qty 1

## 2019-04-24 NOTE — Progress Notes (Signed)
PT Cancellation Note  Patient Details Name: Kathleen Gomez MRN: [id number] DOB: December 12, 1960   Cancelled Treatment:    Reason Eval/Treat Not Completed: Medical issues which prohibited therapy. Pt stated, "Not today. I have a sore throat, I don't feel good. "  Will follow.   Ralene Bathe Kistler PT 04/24/2019  Acute Rehabilitation Services Pager [phone] Office [phone]

## 2019-04-24 NOTE — Progress Notes (Signed)
PROGRESS NOTE    Kathleen Gomez  MVH:[id number] DOB: 19-Jun-1960 DOA: 04/20/2019 PCP: System, Pcp Not In   Brief Narrative: 59 year old female with history of HTN, DVT on Xarelto, lupus, THC abuse, tobacco use, COPD, current anxiety/depression, migraine, HLD, previous cholecystectomy presented to ER via EMS with altered mental status.  Moved to [place] about a month ago from [place] to take care of her elderly father who is terminally ill with bladder cancer and severe COPD.  Patient's father reported patient having nausea vomiting for last 3 to 4 days, poor oral intake and 1 day prior to admission became incoherent that worsened and EMS was called.  In the ER CT head no acute finding, COVID-19 negative, CT abdomen pelvis no hydronephrosis.  UA abnormal with WBC 11-20, with leukocytosis significant acute renal failure electrolyte imbalance, patient was admitted for further management.  Patient was managed aggressively with IV fluid hydration, supportive measures with improvement in renal function.  Subjective:  Sleeping- woke up. Able to tell me her name, dob, thinks she is at her home in [place]. Overnight blood pressure running uncontrolled 160s Afebrile Creatinine improved to 1.0 potassium is still low at 3.1. Answers "I dont know"  When asked date, location, current president- states she lives with her dad who is 29. Smokes marijuana and cigarette " Every chance I get. I will smoke"  Assessment & Plan:  AKI suspect ATN due to severe volume depletion poor oral intake: BUN/creatinine significantly improving with IV fluid hydration.  Continue to monitor intake output, daily labs, avoid nephrotoxic medication.  CT abdomen - hiatal hernia small, kidneys normal without renal calculi focal lesion or hydronephrosis and bladder was unremarkable. Recent Labs  Lab 04/20/19 1408 04/21/19 0804 04/22/19 0451 04/23/19 0438 04/24/19 0521  BUN 57* 56* 43* 28* 17  CREATININE 5.43* 3.74* 2.07* 1.45* 1.05*    Acute metabolic encephalopathy: Suspect multifactorial due to AKI, severe dehydration.  CT head no acute finding.  She has been ambulating.  Nonfocal on exam.  He still appears confused thinks he is at home in [place].  However she tells me about all the medications and why she is on them , she reports that she had come to [place] to take care of her father and reports currently her dog is with her father.  Continue supportive care fall precaution.  Check B12 B1 level. Will obtain MRI brain w/o given ongoing confusion despite improvement in renal function.   Essential hypertension: Blood pressure poorly controlled, continue home clonidine 0.1 twice daily, add amlodipine 5 mg, monitor and adjust meds.   Prolonged QTC: qtc 505, likely from electrolyte imbalance.  Monitor lytes and repeat EKG repeat 1/28 shows QTC improved in 430s  Intractable nausea vomiting: resolved. Suspect cannabinoid hyperemesis syndrome.  CT abdomen pelvis no acute finding.  UDS THC positive on admission.  At this time resolved tolerating diet.  Continue supportive care.  Hypokalemia severe and refractory: Placed 40 meq KCl x2. Recent mag at 2.2.Normally on potassium supplement at home.  Hyponatremia hypovolemic continue normal saline hydration.  Sodium low but stable- 1288  Anemia likely from chronic disease: Hemoglobin is stable in 10 to 11 g  Abnormal UA likely from AKI, on admission also with leukocytosis urine culture no growth Rocephin discontinued.  UTI ruled out.  Abnormal LFTs-elevated AST ALT and total bili: Acute viral hepatitis panel negative.  Level trending down and normal today  Sinus tachycardia likely from dehydration resolved.  History of DVT on xarelto at home:on admission on  heparin infusion and now subsequently switched to Eliquis.  Home Xarelto was discontinued due to poor renal function can consider switching to Xarelto on discharge  Anxiety/depression/history of migraine: Continue home  medication.  HLD: Continue pravastatin.    COPD continue home inhalers.  Substance abuse marijuana abuse tobacco use disorder: Once mental status improves will provide counseling.  Body mass index is 23.87 kg/m.    DVT prophylaxis: eliquis Code Status: Full Family Communication: plan of care discussed with patient at bedside. Disposition Plan: Remains inpatient pending improvement in her mental status.  Continue PT OT.  Home health versus SNF  Consultants: NONE Procedures:see note Microbiology: Antimicrobials: Anti-infectives (From admission, onward)   Start     Dose/Rate Route Frequency Ordered Stop   04/20/19 2100  cefTRIAXone (ROCEPHIN) 1 g in sodium chloride 0.9 % 100 mL IVPB  Status:  Discontinued     1 g 200 mL/hr over 30 Minutes Intravenous Every 24 hours 04/20/19 1830 04/22/19 1357      Medications: Scheduled Meds: . amLODipine  5 mg Oral Daily  . apixaban  5 mg Oral BID  . cloNIDine  0.1 mg Oral BID  . mometasone-formoterol  2 puff Inhalation BID  . potassium chloride  40 mEq Oral Q3H  . pravastatin  10 mg Oral QHS  . topiramate  25 mg Oral QHS  . umeclidinium bromide  1 puff Inhalation Daily   Continuous Infusions: . sodium chloride 75 mL/hr at 04/23/19 2116    Objective: Vitals:   04/23/19 1442 04/23/19 1934 04/23/19 2103 04/24/19 0535  BP: (!) 167/87  (!) 157/75 (!) 160/81  Pulse: 80  83 72  Resp: 18  18 18   Temp: 98 F (36.7 C)  99.3 F (37.4 C) 98.6 F (37 C)  TempSrc: Oral  Oral Oral  SpO2: 99% 96% 98% 97%  Weight:      Height:        Intake/Output Summary (Last 24 hours) at 04/24/2019 0718 Last data filed at 04/24/2019 0535 Gross per 24 hour  Intake 1188 ml  Output -  Net 1188 ml   Filed Weights   04/20/19 1951  Weight: 63.1 kg   Weight change:   Body mass index is 23.87 kg/m.  Intake/Output from previous day: 01/27 0701 - 01/28 0700 In: 1188 [P.O.:1188] Out: -  Intake/Output this shift: No intake/output data recorded.   Examination:  General exam: AAOx2- self, her DOB, not to date/place. Following commands. HEENT:Oral mucosa DRY, Ear/Nose WNL grossly, dentition normal. Respiratory system: b/ clear no  crackles,no use of accessory muscle Cardiovascular system: S1 & S2 +, No JVD,. Gastrointestinal system: Abdomen soft, NT,ND, BS+ Nervous System:Alert, awake,non focal., moving extremities. Extremities: No edema, distal peripheral pulses palpable.  Skin: No rashes,no icterus. MSK: Normal muscle bulk,tone, power   Data Reviewed: I have personally reviewed following labs and imaging studies  CBC: Recent Labs  Lab 04/20/19 1408 04/21/19 0804 04/22/19 0451 04/23/19 0438 04/24/19 0521  WBC 12.3* 8.6 5.5 5.1 5.9  NEUTROABS 10.4* 7.3 4.3 3.8 4.2  HGB 13.2 11.3* 10.3* 10.1* 10.5*  HCT 37.0 33.1* 30.1* 28.5* 28.9*  MCV 90.9 93.8 93.5 91.6 90.0  PLT 323 239 208 215 214   Basic Metabolic Panel: Recent Labs  Lab 04/20/19 1408 04/20/19 1423 04/20/19 2134 04/21/19 0804 04/21/19 1444 04/22/19 0451 04/23/19 0438 04/24/19 0521  NA 131*  --   --  131*  --  128* 128* 128*  K 2.1*  --    < > 2.8*  3.0* 2.8* 3.3* 3.1*  CL <65*  --   --  71*  --  84* 95* 99  CO2 48*  --   --  42*  --  35* 24 21*  GLUCOSE 118*  --   --  98  --  100* 87 99  BUN 57*  --   --  56*  --  43* 28* 17  CREATININE 5.43*  --   --  3.74*  --  2.07* 1.45* 1.05*  CALCIUM 9.1  --   --  8.9  --  8.8* 8.6* 8.5*  MG  --  1.7  --   --   --   --  2.2  --    < > = values in this interval not displayed.   GFR: Estimated Creatinine Clearance: 49.8 mL/min (A) (by C-G formula based on SCr of 1.05 mg/dL (H)). Liver Function Tests: Recent Labs  Lab 04/20/19 1408 04/21/19 0804 04/22/19 0451 04/23/19 0438 04/24/19 0521  AST 148* 100* 66* 41 32  ALT 57* 52* 48* 42 39  ALKPHOS 78 67 57 55 52  BILITOT 2.7* 1.9* 1.3* 1.2 1.2  PROT 8.1 6.8 6.1* 6.0* 5.9*  ALBUMIN 4.7 3.7 3.4* 3.3* 3.1*   No results for input(s): LIPASE, AMYLASE in the last  168 hours. No results for input(s): AMMONIA in the last 168 hours. Coagulation Profile: Recent Labs  Lab 04/20/19 2212  INR 1.1   Cardiac Enzymes: No results for input(s): CKTOTAL, CKMB, CKMBINDEX, TROPONINI in the last 168 hours. BNP (last 3 results) No results for input(s): PROBNP in the last 8760 hours. HbA1C: No results for input(s): HGBA1C in the last 72 hours. CBG: Recent Labs  Lab 04/20/19 1356  GLUCAP 133*   Lipid Profile: No results for input(s): CHOL, HDL, LDLCALC, TRIG, CHOLHDL, LDLDIRECT in the last 72 hours. Thyroid Function Tests: No results for input(s): TSH, T4TOTAL, FREET4, T3FREE, THYROIDAB in the last 72 hours. Anemia Panel: No results for input(s): VITAMINB12, FOLATE, FERRITIN, TIBC, IRON, RETICCTPCT in the last 72 hours. Sepsis Labs: No results for input(s): PROCALCITON, LATICACIDVEN in the last 168 hours.  Recent Results (from the past 240 hour(s))  Respiratory Panel by RT PCR (Flu A&B, Covid) - Urine, Clean Catch     Status: None   Collection Time: 04/20/19  2:43 PM   Specimen: Urine, Clean Catch  Result Value Ref Range Status   SARS Coronavirus 2 by RT PCR NEGATIVE NEGATIVE Final    Comment: (NOTE) SARS-CoV-2 target nucleic acids are NOT DETECTED. The SARS-CoV-2 RNA is generally detectable in upper respiratoy specimens during the acute phase of infection. The lowest concentration of SARS-CoV-2 viral copies this assay can detect is 131 copies/mL. A negative result does not preclude SARS-Cov-2 infection and should not be used as the sole basis for treatment or other patient management decisions. A negative result may occur with  improper specimen collection/handling, submission of specimen other than nasopharyngeal swab, presence of viral mutation(s) within the areas targeted by this assay, and inadequate number of viral copies (<131 copies/mL). A negative result must be combined with clinical observations, patient history, and epidemiological  information. The expected result is Negative. Fact Sheet for Patients:  https://www.moore.com/ Fact Sheet for Healthcare Providers:  https://www.young.biz/ This test is not yet ap proved or cleared by the [place] FDA and  has been authorized for detection and/or diagnosis of SARS-CoV-2 by FDA under an Emergency Use Authorization (EUA). This EUA will remain  in effect (meaning this test  can be used) for the duration of the COVID-19 declaration under Section 564(b)(1) of the Act, 21 U.S.C. section 360bbb-3(b)(1), unless the authorization is terminated or revoked sooner.    Influenza A by PCR NEGATIVE NEGATIVE Final   Influenza B by PCR NEGATIVE NEGATIVE Final    Comment: (NOTE) The Xpert Xpress SARS-CoV-2/FLU/RSV assay is intended as an aid in  the diagnosis of influenza from Nasopharyngeal swab specimens and  should not be used as a sole basis for treatment. Nasal washings and  aspirates are unacceptable for Xpert Xpress SARS-CoV-2/FLU/RSV  testing. Fact Sheet for Patients: https://www.moore.com/ Fact Sheet for Healthcare Providers: https://www.young.biz/ This test is not yet approved or cleared by the [place] FDA and  has been authorized for detection and/or diagnosis of SARS-CoV-2 by  FDA under an Emergency Use Authorization (EUA). This EUA will remain  in effect (meaning this test can be used) for the duration of the  Covid-19 declaration under Section 564(b)(1) of the Act, 21  U.S.C. section 360bbb-3(b)(1), unless the authorization is  terminated or revoked. Performed at [facility], [address], [place], [place] [address]   Urine culture     Status: None   Collection Time: 04/20/19  2:43 PM   Specimen: Urine, Random  Result Value Ref Range Status   Specimen Description   Final    URINE, RANDOM Performed at [facility], [address]  [place], [place] [address]    Special Requests   Final    NONE Performed at [facility], [address], [place], [place] [address]    Culture   Final    NO GROWTH Performed at [facility], [address], [place], [place] [address]    Report Status 04/22/2019 FINAL  Final     Radiology Studies: No results found.    LOS: 4 days   Time spent: More than 50% of that time was spent in counseling and/or coordination of care.  Lanae Boast, MD [facility]  04/24/2019, 7:18 AM

## 2019-04-24 NOTE — TOC Progression Note (Signed)
Transition of Care [facility]) - Progression Note    Patient Details  Name: Kathleen Gomez MRN: [id number] Date of Birth: 08-Jul-1960  Transition of Care [facility]) CM/SW Contact  Ida Rogue, [place] Phone Number: 04/24/2019, 1:55 PM  Clinical Narrative:  Was able to reach brother in [place].  He is unable to give me any information about the patient, stating that they have never been close, that they had no contact when she was in [facility], and it was not he who contacted her about leaving [place] to come help care for her father. His understanding was she had come here to visit, and at that point she and her father made the plans for her to move.  He says no other family members are close with her and would be able to fill in with more information.  TOC will continue to follow during the course of hospitalization.     Expected Discharge Plan: (HH vs SNF) Barriers to Discharge: Other (comment)(Dispositional plan not yet identified; patient with AMS)  Expected Discharge Plan and Services Expected Discharge Plan: (HH vs SNF)   Discharge Planning Services: CM Consult   Living arrangements for the past 2 months: Single Family Home                                       Social Determinants of Health (SDOH) Interventions    Readmission Risk Interventions No flowsheet data found.

## 2019-04-24 NOTE — Progress Notes (Signed)
Pt blood pressure 160/81. Paged NP K.Kirby to inform, RN waiting for orders will continue to monitor

## 2019-04-25 ENCOUNTER — Inpatient Hospital Stay (HOSPITAL_COMMUNITY): Payer: Medicare (Managed Care)

## 2019-04-25 LAB — CBC WITH DIFFERENTIAL/PLATELET
Abs Immature Granulocytes: 0.05 10*3/uL (ref 0.00–0.07)
Basophils Absolute: 0 10*3/uL (ref 0.0–0.1)
Basophils Relative: 0 %
Eosinophils Absolute: 0.2 10*3/uL (ref 0.0–0.5)
Eosinophils Relative: 4 %
HCT: 28.8 % — ABNORMAL LOW (ref 36.0–46.0)
Hemoglobin: 10.2 g/dL — ABNORMAL LOW (ref 12.0–15.0)
Immature Granulocytes: 1 %
Lymphocytes Relative: 13 %
Lymphs Abs: 0.7 10*3/uL (ref 0.7–4.0)
MCH: 32.3 pg (ref 26.0–34.0)
MCHC: 35.4 g/dL (ref 30.0–36.0)
MCV: 91.1 fL (ref 80.0–100.0)
Monocytes Absolute: 0.6 10*3/uL (ref 0.1–1.0)
Monocytes Relative: 11 %
Neutro Abs: 3.8 10*3/uL (ref 1.7–7.7)
Neutrophils Relative %: 71 %
Platelets: 186 10*3/uL (ref 150–400)
RBC: 3.16 MIL/uL — ABNORMAL LOW (ref 3.87–5.11)
RDW: 12.7 % (ref 11.5–15.5)
WBC: 5.4 10*3/uL (ref 4.0–10.5)
nRBC: 0 % (ref 0.0–0.2)

## 2019-04-25 LAB — COMPREHENSIVE METABOLIC PANEL
ALT: 36 U/L (ref 0–44)
AST: 26 U/L (ref 15–41)
Albumin: 3.2 g/dL — ABNORMAL LOW (ref 3.5–5.0)
Alkaline Phosphatase: 56 U/L (ref 38–126)
Anion gap: 5 (ref 5–15)
BUN: 12 mg/dL (ref 6–20)
CO2: 21 mmol/L — ABNORMAL LOW (ref 22–32)
Calcium: 8.5 mg/dL — ABNORMAL LOW (ref 8.9–10.3)
Chloride: 102 mmol/L (ref 98–111)
Creatinine, Ser: 0.98 mg/dL (ref 0.44–1.00)
GFR calc Af Amer: >60 mL/min (ref >60)
GFR calc non Af Amer: >60 mL/min (ref >60)
Glucose, Bld: 114 mg/dL — ABNORMAL HIGH (ref 70–99)
Potassium: 3.5 mmol/L (ref 3.5–5.1)
Sodium: 128 mmol/L — ABNORMAL LOW (ref 135–145)
Total Bilirubin: 0.9 mg/dL (ref 0.3–1.2)
Total Protein: 5.9 g/dL — ABNORMAL LOW (ref 6.5–8.1)

## 2019-04-25 MED ORDER — CLONIDINE HCL 0.1 MG PO TABS: 0.1000 mg | ORAL_TABLET | Freq: Two times a day (BID) | ORAL | 0 refills | Status: AC

## 2019-04-25 MED ORDER — AMLODIPINE BESYLATE 5 MG PO TABS: 5.0000 mg | ORAL_TABLET | Freq: Every day | ORAL | 0 refills | Status: AC

## 2019-04-25 MED ORDER — VITAMIN B-12 100 MCG PO TABS
100.0000 ug | ORAL_TABLET | Freq: Every day | ORAL | Status: DC
  Administered 2019-04-25 – 2019-04-26 (×2): 100 ug via ORAL
  Filled 2019-04-25 (×2): qty 1

## 2019-04-25 MED ORDER — CYANOCOBALAMIN 100 MCG PO TABS: 100.0000 ug | ORAL_TABLET | Freq: Every day | ORAL | 0 refills | Status: AC

## 2019-04-25 NOTE — TOC Progression Note (Signed)
Transition of Care [facility]) - Progression Note    Patient Details  Name: Kathleen Gomez MRN: [id number] Date of Birth: 22-Feb-1961  Transition of Care [facility]) CM/SW Contact  Ida Rogue, [place] Phone Number: 04/25/2019, 11:12 AM  Clinical Narrative:  Today patient is alert, oriented, able to give me much better history.  States she has been going back and forth by car between [facility] and [place] since September to help out her father, usually after hospitalizations.  She is not surprised her insurance has lapsed. "I know you need to sign up every year, and I guess I was in between states during that time. If you get me the number, I will call today to see what I need to do to get it going again."  Number provided. 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 "I had 17 surgeries, and was told I would never walk again."  She is not sure how long she will stay in [place] yet. I assured her I could get her a PCP appointment here, and also get her a voucher for 30 days of Eliquis, for which she is grateful. TOC will continue to follow during the course of hospitalization.     Expected Discharge Plan: Home/Self Care Barriers to Discharge: Barriers Resolved  Expected Discharge Plan and Services Expected Discharge Plan: Home/Self Care   Discharge Planning Services: CM Consult   Living arrangements for the past 2 months: Single Family Home                                       Social Determinants of Health (SDOH) Interventions    Readmission Risk Interventions No flowsheet data found.

## 2019-04-25 NOTE — Care Management Important Message (Signed)
Important Message  Patient Details IM Letter given to Daryel Gerald SW Case Manager to present to the Patient Name: Kathleen Gomez MRN: [id number] Date of Birth: Jul 31, 1960   Medicare Important Message Given:  Yes     Caren Macadam 04/25/2019, 11:40 AM

## 2019-04-25 NOTE — Progress Notes (Signed)
Physical Therapy Treatment Patient Details Name: Kathleen Gomez MRN: [id number] DOB: 05-22-1960 Today's Date: 04/25/2019    History of Present Illness Kathleen Gomez is a 59 y.o. female with medical history significant for HTN, DVT on Xarelto, lupus, THC abuse, tobacco use disorder, COPD, chronic anxiety and depression, migraine, HLD, previous appendectomy and cholecystectomy. Patient presented to [facility] ED from home via EMS with altered mental status. Patient moved to [place] about a month and half ago from [place] to be with her father who is terminally ill with bladder cancer and severe COPD. Patient was brought into the ED for further evaluation of her altered mental status. At baseline she has a normal mentation.    PT Comments    Pt making good progress and has met or nearly met goals (declined trying stairs and does not have stairs at home).  She demonstrated safe gait and transfers independently.  Mentation has return to baseline and demonstrates good safety awareness.  No further acute PT needs.    Follow Up Recommendations  No PT follow up     Equipment Recommendations  None recommended by PT    Recommendations for Other Services       Precautions / Restrictions Precautions Precautions: None    Mobility  Bed Mobility Overal bed mobility: Independent                Transfers Overall transfer level: Independent   Transfers: Sit to/from Stand           General transfer comment: Pt demonstrated sit to stand safely but has been getting up and going to toilet independently.  Ambulation/Gait Ambulation/Gait assistance: Independent;Supervision(Had supervision but would be safe independently) Gait Distance (Feet): 300 Feet Assistive device: None       General Gait Details: normal gait; no LOB; able to look up/down/L/R, stop, change speeds, step over and around objects, and tandem without LOB.   Stairs             Wheelchair Mobility    Modified Rankin (Stroke  Patients Only)       Balance Overall balance assessment: Independent   Sitting balance-Leahy Scale: Normal       Standing balance-Leahy Scale: Normal                              Cognition Arousal/Alertness: Awake/alert Behavior During Therapy: WFL for tasks assessed/performed Overall Cognitive Status: Within Functional Limits for tasks assessed                                        Exercises      General Comments        Pertinent Vitals/Pain Pain Assessment: No/denies pain    Home Living                      Prior Function            PT Goals (current goals can now be found in the care plan section) Progress towards PT goals: Goals met/education completed, patient discharged from PT    Frequency           PT Plan Frequency needs to be updated;Discharge plan needs to be updated    Co-evaluation              AM-PAC PT "6 Clicks" Mobility   Outcome  Measure  Help needed turning from your back to your side while in a flat bed without using bedrails?: None Help needed moving from lying on your back to sitting on the side of a flat bed without using bedrails?: None Help needed moving to and from a bed to a chair (including a wheelchair)?: None Help needed standing up from a chair using your arms (e.g., wheelchair or bedside chair)?: None Help needed to walk in hospital room?: None Help needed climbing 3-5 steps with a railing? : None 6 Click Score: 24    End of Session   Activity Tolerance: Patient tolerated treatment well Patient left: in bed;with call bell/phone within reach(no alarm - pt has been getting up indepednently) Nurse Communication: Mobility status PT Visit Diagnosis: Other abnormalities of gait and mobility (R26.89);Difficulty in walking, not elsewhere classified (R26.2)     Time: 3267-1245 PT Time Calculation (min) (ACUTE ONLY): 11 min  Charges:  $Gait Training: 8-22 mins                      Maggie Font, PT Acute Rehab Services Pager [phone] [facility] [phone] [facility] [phone]    Karlton Lemon 04/25/2019, 4:29 PM

## 2019-04-25 NOTE — Progress Notes (Signed)
PT Cancellation Note  Patient Details Name: Kathleen Gomez MRN: [id number] DOB: 02-01-1961   Cancelled Treatment:    Reason Eval/Treat Not Completed: Patient declined, no reason specified  Pt states "I'm not in the mood."  Tried to encourage and educate on importance of maintaining mobility but continued to refuse and OOB activity.  Will f/u as able.  Royetta Asal, PT Acute Rehab Services Pager [phone] [facility] [phone] [facility] [phone]    Rayetta Humphrey 04/25/2019, 1:35 PM

## 2019-04-25 NOTE — Progress Notes (Signed)
PROGRESS NOTE    Kathleen Gomez  NID:[id number] DOB: 25-Jul-1960 DOA: 04/20/2019 PCP: System, Pcp Not In   Brief Narrative: 59 year old female with history of HTN, DVT on Xarelto, lupus, THC abuse, tobacco use, COPD, current anxiety/depression, migraine, HLD, previous cholecystectomy presented to ER via EMS with altered mental status.  Moved to [place] about a month ago from [place] to take care of her elderly father who is terminally ill with bladder cancer and severe COPD.  Patient's father reported patient having nausea vomiting for last 3 to 4 days, poor oral intake and 1 day prior to admission became incoherent that worsened and EMS was called.  In the ER CT head no acute finding, COVID-19 negative, CT abdomen pelvis no hydronephrosis.  UA abnormal with WBC 11-20, with leukocytosis significant acute renal failure electrolyte imbalance, patient was admitted for further management.  Patient was managed aggressively with IV fluid hydration, supportive measures with improvement in renal function.  Renal function has returned to baseline.  She also had MRI of the brain with changes likely reflective of chronic microvascular changes no concern of vasculitis or demyelinating process. She is ambulating well she is alert and oriented.  Subjective: Patient appeared bit frustrated this morning.  Did not work with physical therapy. Later on agreed to work with physical therapy. Smokes marijuana and cigarette " Every chance I get. I will smoke"  Assessment & Plan:  AKI suspect ATN due to severe volume depletion poor oral intake: BUN/creatinine significantly improved to baseline. Continue oral hydration and stop IV fluids.CT abdomen - hiatal hernia small, kidneys normal without renal calculi focal lesion or hydronephrosis and bladder was unremarkable. Recent Labs  Lab 04/21/19 0804 04/22/19 0451 04/23/19 0438 04/24/19 0521 04/25/19 0448  BUN 56* 43* 28* 17 12  CREATININE 3.74* 2.07* 1.45* 1.05* 0.98    Acute metabolic encephalopathy: Suspect multifactorial due to AKI, severe dehydration MRI motion degraded, no acute finding, scattered T2 hyperintense signal, discussed with neurologist Dr Wilford Corner given her multiple risk factors suspect this is small vessel ischemic disease.  Since patient's mental status has resolved  no concern for other etiology like infectious/inflammatory or demyelinating process-neuro advised no further work-up.  Is nonfocal on exam.  B12 on lower side continue B12 supplementation.  Essential hypertension: Blood pressure fairly stable continue home clonidine, added amlodipine 5 mg.    Prolonged QTC: qtc 505, likely from electrolyte imbalance.  Monitor lytes and repeat EKG repeat 1/28 shows QTC improved in 430s  Intractable nausea vomiting: resolved. Suspect cannabinoid hyperemesis syndrome.  CT abdomen pelvis no acute finding.  UDS THC positive on admission.  At this time resolved tolerating diet.  Continue supportive care.  Hypokalemia -resolved she is on potassium supplementation and will continue.    Hyponatremia hypovolemic-stable    Anemia likely from chronic disease: Hemoglobin is stable in 10 to 11 g  Abnormal UA likely from AKI, on admission also with leukocytosis urine culture no growth Rocephin discontinued.  UTI ruled out.  Abnormal LFTs-elevated AST ALT and total bili: Acute viral hepatitis panel negative.  Level trending down and normal today  Sinus tachycardia likely from dehydration resolved.  History of DVT on xarelto at home:on admission on heparin infusion and now subsequently switched to Eliquis.  Home Xarelto was discontinued due to poor renal function-since AKI resolved patient is back on Xarelto.   Anxiety/depression/history of migraine: Continue home medication.  HLD: Continue pravastatin.    COPD continue home inhalers.  Substance abuse marijuana abuse tobacco use disorder:  Body  mass index is 23.87 kg/m.    DVT prophylaxis:  eliquis Code Status: Full Family Communication: plan of care discussed with patient at bedside. Disposition Plan: await PT eval for dispo. Patient reluctant to go home tonight.Plan on discharge tomorrow  Consultants: NONE Procedures:see note Microbiology: Antimicrobials: Anti-infectives (From admission, onward)   Start     Dose/Rate Route Frequency Ordered Stop   04/20/19 2100  cefTRIAXone (ROCEPHIN) 1 g in sodium chloride 0.9 % 100 mL IVPB  Status:  Discontinued     1 g 200 mL/hr over 30 Minutes Intravenous Every 24 hours 04/20/19 1830 04/22/19 1357      Medications: Scheduled Meds:  amLODipine  5 mg Oral Daily   cloNIDine  0.1 mg Oral BID   mometasone-formoterol  2 puff Inhalation BID   pravastatin  10 mg Oral QHS   rivaroxaban  20 mg Oral Q supper   topiramate  25 mg Oral QHS   umeclidinium bromide  1 puff Inhalation Daily   vitamin B-12  100 mcg Oral Daily   Continuous Infusions:   Objective: Vitals:   04/24/19 2005 04/24/19 2049 04/25/19 0426 04/25/19 1301  BP:  (!) 163/85 (!) 152/88 (!) 146/78  Pulse:  83 85 89  Resp:  18 18 20   Temp:  98.2 F (36.8 C) 98 F (36.7 C) 98.8 F (37.1 C)  TempSrc:  Oral Oral Oral  SpO2: 98% 100% 100% 100%  Weight:      Height:        Intake/Output Summary (Last 24 hours) at 04/25/2019 1536 Last data filed at 04/24/2019 2337 Gross per 24 hour  Intake 120 ml  Output --  Net 120 ml   Filed Weights   04/20/19 1951  Weight: 63.1 kg   Weight change:   Body mass index is 23.87 kg/m.  Intake/Output from previous day: 01/28 0701 - 01/29 0700 In: 120 [P.O.:120] Out: -  Intake/Output this shift: No intake/output data recorded.  Examination:  General exam: AAOx3, NAD, on RA HEENT:Oral mucosa moist, Ear/Nose WNL grossly, dentition normal. Respiratory system: b/ clear no  crackles,no use of accessory muscle Cardiovascular system: S1 & S2 +, No JVD,. Gastrointestinal system: Abdomen soft, NT,ND, BS+ Nervous  System:Alert, awake,non focal., moving extremities. Extremities: No edema, distal peripheral pulses palpable.  Skin: No rashes,no icterus. MSK: Normal muscle bulk,tone, power   Data Reviewed: I have personally reviewed following labs and imaging studies  CBC: Recent Labs  Lab 04/21/19 0804 04/22/19 0451 04/23/19 0438 04/24/19 0521 04/25/19 0448  WBC 8.6 5.5 5.1 5.9 5.4  NEUTROABS 7.3 4.3 3.8 4.2 3.8  HGB 11.3* 10.3* 10.1* 10.5* 10.2*  HCT 33.1* 30.1* 28.5* 28.9* 28.8*  MCV 93.8 93.5 91.6 90.0 91.1  PLT 239 208 215 214 186   Basic Metabolic Panel: Recent Labs  Lab 04/20/19 1423 04/20/19 2134 04/21/19 0804 04/21/19 0804 04/21/19 1444 04/22/19 0451 04/23/19 0438 04/24/19 0521 04/25/19 0448  NA  --   --  131*  --   --  128* 128* 128* 128*  K  --    < > 2.8*   < > 3.0* 2.8* 3.3* 3.1* 3.5  CL  --   --  71*  --   --  84* 95* 99 102  CO2  --   --  42*  --   --  35* 24 21* 21*  GLUCOSE  --   --  98  --   --  100* 87 99 114*  BUN  --   --  56*  --   --  43* 28* 17 12  CREATININE  --   --  3.74*  --   --  2.07* 1.45* 1.05* 0.98  CALCIUM  --   --  8.9  --   --  8.8* 8.6* 8.5* 8.5*  MG 1.7  --   --   --   --   --  2.2  --   --    < > = values in this interval not displayed.   GFR: Estimated Creatinine Clearance: 53.4 mL/min (by C-G formula based on SCr of 0.98 mg/dL). Liver Function Tests: Recent Labs  Lab 04/21/19 0804 04/22/19 0451 04/23/19 0438 04/24/19 0521 04/25/19 0448  AST 100* 66* 41 32 26  ALT 52* 48* 42 39 36  ALKPHOS 67 57 55 52 56  BILITOT 1.9* 1.3* 1.2 1.2 0.9  PROT 6.8 6.1* 6.0* 5.9* 5.9*  ALBUMIN 3.7 3.4* 3.3* 3.1* 3.2*   No results for input(s): LIPASE, AMYLASE in the last 168 hours. No results for input(s): AMMONIA in the last 168 hours. Coagulation Profile: Recent Labs  Lab 04/20/19 2212  INR 1.1   Cardiac Enzymes: No results for input(s): CKTOTAL, CKMB, CKMBINDEX, TROPONINI in the last 168 hours. BNP (last 3 results) No results for  input(s): PROBNP in the last 8760 hours. HbA1C: No results for input(s): HGBA1C in the last 72 hours. CBG: Recent Labs  Lab 04/20/19 1356  GLUCAP 133*   Lipid Profile: No results for input(s): CHOL, HDL, LDLCALC, TRIG, CHOLHDL, LDLDIRECT in the last 72 hours. Thyroid Function Tests: No results for input(s): TSH, T4TOTAL, FREET4, T3FREE, THYROIDAB in the last 72 hours. Anemia Panel: Recent Labs    04/24/19 0937  VITAMINB12 313   Sepsis Labs: No results for input(s): PROCALCITON, LATICACIDVEN in the last 168 hours.  Recent Results (from the past 240 hour(s))  Respiratory Panel by RT PCR (Flu A&B, Covid) - Urine, Clean Catch     Status: None   Collection Time: 04/20/19  2:43 PM   Specimen: Urine, Clean Catch  Result Value Ref Range Status   SARS Coronavirus 2 by RT PCR NEGATIVE NEGATIVE Final    Comment: (NOTE) SARS-CoV-2 target nucleic acids are NOT DETECTED. The SARS-CoV-2 RNA is generally detectable in upper respiratoy specimens during the acute phase of infection. The lowest concentration of SARS-CoV-2 viral copies this assay can detect is 131 copies/mL. A negative result does not preclude SARS-Cov-2 infection and should not be used as the sole basis for treatment or other patient management decisions. A negative result may occur with  improper specimen collection/handling, submission of specimen other than nasopharyngeal swab, presence of viral mutation(s) within the areas targeted by this assay, and inadequate number of viral copies (<131 copies/mL). A negative result must be combined with clinical observations, patient history, and epidemiological information. The expected result is Negative. Fact Sheet for Patients:  https://www.moore.com/ Fact Sheet for Healthcare Providers:  https://www.young.biz/ This test is not yet ap proved or cleared by the [place] FDA and  has been authorized for detection and/or diagnosis of  SARS-CoV-2 by FDA under an Emergency Use Authorization (EUA). This EUA will remain  in effect (meaning this test can be used) for the duration of the COVID-19 declaration under Section 564(b)(1) of the Act, 21 U.S.C. section 360bbb-3(b)(1), unless the authorization is terminated or revoked sooner.    Influenza A by PCR NEGATIVE NEGATIVE Final   Influenza B by PCR NEGATIVE NEGATIVE Final    Comment: (NOTE) The  Xpert Xpress SARS-CoV-2/FLU/RSV assay is intended as an aid in  the diagnosis of influenza from Nasopharyngeal swab specimens and  should not be used as a sole basis for treatment. Nasal washings and  aspirates are unacceptable for Xpert Xpress SARS-CoV-2/FLU/RSV  testing. Fact Sheet for Patients: PinkCheek.be Fact Sheet for Healthcare Providers: GravelBags.it This test is not yet approved or cleared by the [place] FDA and  has been authorized for detection and/or diagnosis of SARS-CoV-2 by  FDA under an Emergency Use Authorization (EUA). This EUA will remain  in effect (meaning this test can be used) for the duration of the  Covid-19 declaration under Section 564(b)(1) of the Act, 21  U.S.C. section 360bbb-3(b)(1), unless the authorization is  terminated or revoked. Performed at [facility], [place] [address] [place], [place] [address]   Urine culture     Status: None   Collection Time: 04/20/19  2:43 PM   Specimen: Urine, Random  Result Value Ref Range Status   Specimen Description   Final    URINE, RANDOM Performed at [place] [address] [place], [place] [address]    Special Requests   Final    NONE Performed at [facility], [place] [address], [place], [place] [address]    Culture   Final    NO GROWTH Performed at [facility], [place] [address], [place], [place] [address]    Report Status 04/22/2019 FINAL  Final     Radiology  Studies: MR BRAIN WO CONTRAST  Result Date: 04/25/2019 CLINICAL DATA:  Encephalopathy. Additional history provided: Patient with history of hypertension, DVT on Xarelto, lupus, THC abuse, tobacco use, COPD, current anxiety/depression, migraine, hyperlipidemia, previous cholecystectomy presenting with altered mental status. EXAM: MRI HEAD WITHOUT CONTRAST TECHNIQUE: Multiplanar, multiecho pulse sequences of the brain and surrounding structures were obtained without intravenous contrast. COMPARISON:  Head CT 04/20/2019 FINDINGS: Brain: The examination is motion degraded, limiting evaluation. Most notably there is moderate motion degradation of the axial T2 weighted sequence, moderate/severe motion degradation of the axial T2 FLAIR sequence and moderate/severe motion degradation of the axial T1 weighted sequence. There is no evidence of acute infarct. No evidence of intracranial mass. No midline shift or extra-axial fluid collection. Punctate chronic microhemorrhage within the right parietal white matter (series 8, image 21). Scattered T2/FLAIR hyperintensity within the cerebral white matter is advanced for age and nonspecific. Mild ill-defined T2/FLAIR hyperintensity is also present within the pons. Cerebral volume is normal for age.  Partially empty sella turcica. Vascular: Flow voids maintained within the proximal large arterial vessels. Skull and upper cervical spine: No focal marrow lesion Sinuses/Orbits: Visualized orbits demonstrate no acute abnormality. Moderate-sized right maxillary sinus mucous retention cyst. Trace fluid within bilateral mastoid air cells. IMPRESSION: Motion degraded and limited examination as described. No evidence of acute intracranial abnormality. Scattered T2 hyperintense signal changes within the cerebral white matter and pons are advanced for age. Findings are nonspecific but most commonly seen on the basis of chronic small vessel ischemic disease. Potential alternative etiologies  include vasculitis, sequela of a prior infectious/inflammatory process or a demyelinating process such as multiple sclerosis, among others. Electronically Signed   By: Kellie Simmering DO   On: 04/25/2019 11:37      LOS: 5 days   Time spent: More than 50% of that time was spent in counseling and/or coordination of care.  Antonieta Pert, MD [facility]  04/25/2019, 3:36 PM

## 2019-04-26 NOTE — Discharge Summary (Signed)
Physician Discharge Summary  Skye Plamondon FUX:[id number] DOB: [birth year] DOA: 04/20/2019  PCP: System, Pcp Not In  Admit date: 04/20/2019 Discharge date: 04/26/2019  Admitted From: home Disposition:  home  Recommendations for Outpatient Follow-up:  Follow up with PCP in 1-2 weeks Please obtain BMP/CBC in one week Please follow up on the following pending results:  Home Health:none  Equipment/Devices: none  Discharge Condition: Stable Code Status: stable Diet recommendation: Heart Healthy  Brief/Interim Summary: 59 year old female with history of HTN, DVT on Xarelto, lupus, THC abuse, tobacco use, COPD, current anxiety/depression, migraine, HLD, previous cholecystectomy presented to ER via EMS with altered mental status.  Moved to [place] about a month ago from [place] to take care of her elderly father who is terminally ill with bladder cancer and severe COPD.  Patient's father reported patient having nausea vomiting for last 3 to 4 days, poor oral intake and 1 day prior to admission became incoherent that worsened and EMS was called.  In the ER CT head no acute finding, COVID-19 negative, CT abdomen pelvis no hydronephrosis.  UA abnormal with WBC 11-20, with leukocytosis significant acute renal failure electrolyte imbalance, patient was admitted for further management.  Patient was managed aggressively with IV fluid hydration, supportive measures with improvement in renal function.  Renal function has returned to baseline.  She also had MRI of the brain with changes likely reflective of chronic microvascular changes no concern of vasculitis or demyelinating process. She is ambulating well she is alert and oriented, back to baseline.  Patient was seen by PT  1/29 again and no further PT needs.  Patient will be discharged home.  Discharge Diagnoses:   AKI suspect ATN due to severe volume depletion poor oral intake: BUN/creatinine improved and has normalized.  Encourage oral hydration.  Follow-up  with PCP.  Acute metabolic encephalopathy: Suspect multifactorial due to AKI, severe dehydration MRI motion degraded, no acute finding, scattered T2 hyperintense signal, discussed with neurologist Dr Rory Percy given her multiple risk factors suspect this is small vessel ischemic disease.  Since patient's mental status has resolved  no concern for other etiology like infectious/inflammatory or demyelinating process-neuro advised no further work-up.  Is nonfocal on exam.  B12 on lower side continue B12 supplementation.  Essential hypertension: Blood pressure fairly stable continue home clonidine, added amlodipine 5 mg.   Prolonged QTC: qtc 505, likely from electrolyte imbalance.  Monitor lytes and repeat EKG repeat 1/28 shows QTC improved in 430s Intractable nausea vomiting: resolved. Suspect cannabinoid hyperemesis syndrome.  CT abdomen pelvis no acute finding.  UDS THC positive on admission.  At this time resolved tolerating diet.  Continue supportive care Hypokalemia -resolved she is on potassium supplementation -CONT SAME Hyponatremia hypovolemic- slightly low advised follow-up with PCP  Anemia likely from chronic disease: Hemoglobin is stable in 10 to 11 g. Abnormal UA likely from AKI, on admission also with leukocytosis urine culture no growth Rocephin discontinued.  UTI ruled out. Abnormal LFTs-elevated AST ALT and total bili: Acute viral hepatitis panel negative.  Level trending down and normalized Sinus tachycardia likely from dehydration resolved. History of DVT on xarelto at home:on admission on heparin infusion and now subsequently switched to Eliquis.  Home Xarelto was discontinued due to poor renal function-since AKI resolved patient is back on Xarelto. Anxiety/depression/history of migraine: Continue home medication. HLD: Continue pravastatin.  COPD continue home inhalers. Substance abuse marijuana abuse tobacco use disorder: She has been advised to quit. Body mass index is 23.87 kg/m.   DVT prophylaxis: eliquis Code  Status: Full Family Communication: plan of care discussed with patient at bedside. Disposition Plan: FOR HOME TODAY Consultants: NONE  Subjective: Resting comfortably on room air.  No nausea vomiting.  No acute events overnight.  Alert awake oriented x4.   Discharge Exam: Vitals:   04/26/19 0500 04/26/19 0744  BP: (!) 147/82   Pulse: 92 89  Resp: 14 18  Temp: 98.3 F (36.8 C)   SpO2: 100% 100%   General: Pt is alert, awake, not in acute distress Cardiovascular: RRR, S1/S2 +, no rubs, no gallops Respiratory: CTA bilaterally, no wheezing, no rhonchi Abdominal: Soft, NT, ND, bowel sounds + Extremities: no edema, no cyanosis  Discharge Instructions  Discharge Instructions     Diet - low sodium heart healthy   Complete by: As directed    Discharge instructions   Complete by: As directed    Please call call MD or return to ER for similar or worsening recurring problem that brought you to hospital or if any fever,nausea/vomiting,abdominal pain, uncontrolled pain, chest pain,  shortness of breath or any other alarming symptoms.  Please follow-up your doctor as instructed in a week time and call the office for appointment.  Please avoid alcohol, smoking, or any other illicit substance and maintain healthy habits including taking your regular medications as prescribed.  You were cared for by a hospitalist during your hospital stay. If you have any questions about your discharge medications or the care you received while you were in the hospital after you are discharged, you can call the unit and ask to speak with the hospitalist on call if the hospitalist that took care of you is not available.  Once you are discharged, your primary care physician will handle any further medical issues. Please note that NO REFILLS for any discharge medications will be authorized once you are discharged, as it is imperative that you return to your primary care physician  (or establish a relationship with a primary care physician if you do not have one) for your aftercare needs so that they can reassess your need for medications and monitor your lab values. Ensure adequate oral hydration.   Increase activity slowly   Complete by: As directed       Allergies as of 04/26/2019   No Known Allergies      Medication List     TAKE these medications    albuterol 108 (90 Base) MCG/ACT inhaler Commonly known as: VENTOLIN HFA Inhale 1 puff into the lungs every 6 (six) hours as needed for wheezing or shortness of breath.   amLODipine 5 MG tablet Commonly known as: NORVASC Take 1 tablet (5 mg total) by mouth daily.   cloNIDine 0.1 MG tablet Commonly known as: CATAPRES Take 1 tablet (0.1 mg total) by mouth 2 (two) times daily.   cyanocobalamin 100 MCG tablet Take 1 tablet (100 mcg total) by mouth daily.   escitalopram 10 MG tablet Commonly known as: LEXAPRO Take 1 tablet (10 mg total) by mouth daily.   gabapentin 600 MG tablet Commonly known as: NEURONTIN Take 1 tablet (600 mg total) by mouth 3 (three) times daily.   mirtazapine 45 MG tablet Commonly known as: REMERON Take 1 tablet (45 mg total) by mouth at bedtime.   potassium chloride 10 MEQ CR capsule Commonly known as: MICRO-K Take 1 capsule (10 mEq total) by mouth daily.   pravastatin 10 MG tablet Commonly known as: PRAVACHOL Take 1 tablet (10 mg total) by mouth at bedtime.   promethazine 25  MG suppository Commonly known as: PHENERGAN Place 1 suppository (25 mg total) rectally every 8 (eight) hours as needed for nausea or vomiting.   Spiriva HandiHaler 18 MCG inhalation capsule Generic drug: tiotropium Place 1 capsule (18 mcg total) into inhaler and inhale daily.   Symbicort 80-4.5 MCG/ACT inhaler Generic drug: budesonide-formoterol Inhale 1 puff into the lungs 2 (two) times daily.   topiramate 25 MG tablet Commonly known as: TOPAMAX Take 1 tablet (25 mg total) by mouth at  bedtime.   Xarelto 20 MG Tabs tablet Generic drug: rivaroxaban Take 1 tablet (20 mg total) by mouth daily.       Follow-up Information     [facility] Follow up on 05/01/2019.   Why: Thursday at 3:50 for your hospital follow up appointment Contact information: [address] [place] [address] [phone]          No Known Allergies  The results of significant diagnostics from this hospitalization (including imaging, microbiology, ancillary and laboratory) are listed below for reference.    Microbiology: Recent Results (from the past 240 hour(s))  Respiratory Panel by RT PCR (Flu A&B, Covid) - Urine, Clean Catch     Status: None   Collection Time: 04/20/19  2:43 PM   Specimen: Urine, Clean Catch  Result Value Ref Range Status   SARS Coronavirus 2 by RT PCR NEGATIVE NEGATIVE Final    Comment: (NOTE) SARS-CoV-2 target nucleic acids are NOT DETECTED. The SARS-CoV-2 RNA is generally detectable in upper respiratoy specimens during the acute phase of infection. The lowest concentration of SARS-CoV-2 viral copies this assay can detect is 131 copies/mL. A negative result does not preclude SARS-Cov-2 infection and should not be used as the sole basis for treatment or other patient management decisions. A negative result may occur with  improper specimen collection/handling, submission of specimen other than nasopharyngeal swab, presence of viral mutation(s) within the areas targeted by this assay, and inadequate number of viral copies (<131 copies/mL). A negative result must be combined with clinical observations, patient history, and epidemiological information. The expected result is Negative. Fact Sheet for Patients:  https://www.moore.com/ Fact Sheet for Healthcare Providers:  https://www.young.biz/ This test is not yet ap proved or cleared by the [place] FDA and  has been  authorized for detection and/or diagnosis of SARS-CoV-2 by FDA under an Emergency Use Authorization (EUA). This EUA will remain  in effect (meaning this test can be used) for the duration of the COVID-19 declaration under Section 564(b)(1) of the Act, 21 U.S.C. section 360bbb-3(b)(1), unless the authorization is terminated or revoked sooner.    Influenza A by PCR NEGATIVE NEGATIVE Final   Influenza B by PCR NEGATIVE NEGATIVE Final    Comment: (NOTE) The Xpert Xpress SARS-CoV-2/FLU/RSV assay is intended as an aid in  the diagnosis of influenza from Nasopharyngeal swab specimens and  should not be used as a sole basis for treatment. Nasal washings and  aspirates are unacceptable for Xpert Xpress SARS-CoV-2/FLU/RSV  testing. Fact Sheet for Patients: https://www.moore.com/ Fact Sheet for Healthcare Providers: https://www.young.biz/ This test is not yet approved or cleared by the [place] FDA and  has been authorized for detection and/or diagnosis of SARS-CoV-2 by  FDA under an Emergency Use Authorization (EUA). This EUA will remain  in effect (meaning this test can be used) for the duration of the  Covid-19 declaration under Section 564(b)(1) of the Act, 21  U.S.C. section 360bbb-3(b)(1), unless the authorization is  terminated or  revoked. Performed at [facility], [address] [place], [place] [address]   Urine culture     Status: None   Collection Time: 04/20/19  2:43 PM   Specimen: Urine, Random  Result Value Ref Range Status   Specimen Description   Final    URINE, RANDOM Performed at [facility], [address] [place], [place] [address]    Special Requests   Final    NONE Performed at [facility], [address] [place], [place] [address]    Culture   Final    NO GROWTH Performed at [facility], [address] [place], [place] [address]    Report Status  04/22/2019 FINAL  Final    Procedures/Studies: CT ABDOMEN PELVIS WO CONTRAST  Result Date: 04/20/2019 CLINICAL DATA:  Nausea and vomiting. EXAM: CT ABDOMEN AND PELVIS WITHOUT CONTRAST TECHNIQUE: Multidetector CT imaging of the abdomen and pelvis was performed following the standard protocol without IV contrast. COMPARISON:  February 23, 2019 FINDINGS: Lower chest: No acute abnormality. Hepatobiliary: No focal liver abnormality is seen. Status post cholecystectomy. No biliary dilatation. Pancreas: Unremarkable. No pancreatic ductal dilatation or surrounding inflammatory changes. Spleen: Normal in size without focal abnormality. Adrenals/Urinary Tract: Adrenal glands are unremarkable. Kidneys are normal, without renal calculi, focal lesion, or hydronephrosis. Bladder is unremarkable. Stomach/Bowel: There is a small hiatal hernia. The appendix is not identified. No evidence of bowel wall thickening, distention, or inflammatory changes. Vascular/Lymphatic: There is moderate to marked severity aortic atherosclerosis. An inferior vena cava filter is noted. No enlarged abdominal or pelvic lymph nodes. Reproductive: Uterus and bilateral adnexa are unremarkable. Other: No abdominal wall hernia or abnormality. No abdominopelvic ascites. Musculoskeletal: Mild multilevel degenerative changes are seen throughout the lumbar spine. IMPRESSION: 1. Small hiatal hernia. 2. Evidence of prior cholecystectomy. 3. Inferior vena cava filter. Electronically Signed   By: Aram Candelahaddeus  Houston M.D.   On: 04/20/2019 15:54   CT HEAD WO CONTRAST  Result Date: 04/20/2019 CLINICAL DATA:  Encephalopathy EXAM: CT HEAD WITHOUT CONTRAST TECHNIQUE: Contiguous axial images were obtained from the base of the skull through the vertex without intravenous contrast. COMPARISON:  None. FINDINGS: Brain: No evidence of acute infarction, hemorrhage, extra-axial collection, ventriculomegaly, or mass effect. Generalized cerebral atrophy. Vascular:  Cerebrovascular atherosclerotic calcifications are noted. Skull: Negative for fracture or focal lesion. Sinuses/Orbits: Visualized portions of the orbits are unremarkable. Visualized portions of the paranasal sinuses are unremarkable. Visualized portions of the mastoid air cells are unremarkable. Other: None. IMPRESSION: No acute intracranial pathology. Electronically Signed   By: Elige KoHetal  Patel   On: 04/20/2019 14:28   MR BRAIN WO CONTRAST  Result Date: 04/25/2019 CLINICAL DATA:  Encephalopathy. Additional history provided: Patient with history of hypertension, DVT on Xarelto, lupus, THC abuse, tobacco use, COPD, current anxiety/depression, migraine, hyperlipidemia, previous cholecystectomy presenting with altered mental status. EXAM: MRI HEAD WITHOUT CONTRAST TECHNIQUE: Multiplanar, multiecho pulse sequences of the brain and surrounding structures were obtained without intravenous contrast. COMPARISON:  Head CT 04/20/2019 FINDINGS: Brain: The examination is motion degraded, limiting evaluation. Most notably there is moderate motion degradation of the axial T2 weighted sequence, moderate/severe motion degradation of the axial T2 FLAIR sequence and moderate/severe motion degradation of the axial T1 weighted sequence. There is no evidence of acute infarct. No evidence of intracranial mass. No midline shift or extra-axial fluid collection. Punctate chronic microhemorrhage within the right parietal white matter (series 8, image 21). Scattered T2/FLAIR hyperintensity within the cerebral white matter is  advanced for age and nonspecific. Mild ill-defined T2/FLAIR hyperintensity is also present within the pons. Cerebral volume is normal for age.  Partially empty sella turcica. Vascular: Flow voids maintained within the proximal large arterial vessels. Skull and upper cervical spine: No focal marrow lesion Sinuses/Orbits: Visualized orbits demonstrate no acute abnormality. Moderate-sized right maxillary sinus mucous  retention cyst. Trace fluid within bilateral mastoid air cells. IMPRESSION: Motion degraded and limited examination as described. No evidence of acute intracranial abnormality. Scattered T2 hyperintense signal changes within the cerebral white matter and pons are advanced for age. Findings are nonspecific but most commonly seen on the basis of chronic small vessel ischemic disease. Potential alternative etiologies include vasculitis, sequela of a prior infectious/inflammatory process or a demyelinating process such as multiple sclerosis, among others. Electronically Signed   By: Jackey Loge DO   On: 04/25/2019 11:37   DG Chest Portable 1 View  Result Date: 04/20/2019 CLINICAL DATA:  Altered mental status. EXAM: PORTABLE CHEST 1 VIEW COMPARISON:  None. FINDINGS: The heart size and mediastinal contours are within normal limits. Both lungs are clear. The visualized skeletal structures are unremarkable. IMPRESSION: No active disease.  No evidence of pneumonia. Electronically Signed   By: Bary Richard M.D.   On: 04/20/2019 14:31    Labs: BNP (last 3 results) No results for input(s): BNP in the last 8760 hours. Basic Metabolic Panel: Recent Labs  Lab 04/20/19 1423 04/20/19 2134 04/21/19 0804 04/21/19 0804 04/21/19 1444 04/22/19 0451 04/23/19 0438 04/24/19 0521 04/25/19 0448  NA  --   --  131*  --   --  128* 128* 128* 128*  K  --    < > 2.8*   < > 3.0* 2.8* 3.3* 3.1* 3.5  CL  --   --  71*  --   --  84* 95* 99 102  CO2  --   --  42*  --   --  35* 24 21* 21*  GLUCOSE  --   --  98  --   --  100* 87 99 114*  BUN  --   --  56*  --   --  43* 28* 17 12  CREATININE  --   --  3.74*  --   --  2.07* 1.45* 1.05* 0.98  CALCIUM  --   --  8.9  --   --  8.8* 8.6* 8.5* 8.5*  MG 1.7  --   --   --   --   --  2.2  --   --    < > = values in this interval not displayed.   Liver Function Tests: Recent Labs  Lab 04/21/19 0804 04/22/19 0451 04/23/19 0438 04/24/19 0521 04/25/19 0448  AST 100* 66* 41 32 26   ALT 52* 48* 42 39 36  ALKPHOS 67 57 55 52 56  BILITOT 1.9* 1.3* 1.2 1.2 0.9  PROT 6.8 6.1* 6.0* 5.9* 5.9*  ALBUMIN 3.7 3.4* 3.3* 3.1* 3.2*   No results for input(s): LIPASE, AMYLASE in the last 168 hours. No results for input(s): AMMONIA in the last 168 hours. CBC: Recent Labs  Lab 04/21/19 0804 04/22/19 0451 04/23/19 0438 04/24/19 0521 04/25/19 0448  WBC 8.6 5.5 5.1 5.9 5.4  NEUTROABS 7.3 4.3 3.8 4.2 3.8  HGB 11.3* 10.3* 10.1* 10.5* 10.2*  HCT 33.1* 30.1* 28.5* 28.9* 28.8*  MCV 93.8 93.5 91.6 90.0 91.1  PLT 239 208 215 214 186   Cardiac Enzymes: No results for input(s): CKTOTAL, CKMB,  CKMBINDEX, TROPONINI in the last 168 hours. BNP: Invalid input(s): POCBNP CBG: Recent Labs  Lab 04/20/19 1356  GLUCAP 133*   D-Dimer No results for input(s): DDIMER in the last 72 hours. Hgb A1c No results for input(s): HGBA1C in the last 72 hours. Lipid Profile No results for input(s): CHOL, HDL, LDLCALC, TRIG, CHOLHDL, LDLDIRECT in the last 72 hours. Thyroid function studies No results for input(s): TSH, T4TOTAL, T3FREE, THYROIDAB in the last 72 hours.  Invalid input(s): FREET3 Anemia work up Recent Labs    04/24/19 0937  VITAMINB12 313   Urinalysis    Component Value Date/Time   COLORURINE AMBER (A) 04/20/2019 1443   APPEARANCEUR CLOUDY (A) 04/20/2019 1443   LABSPEC 1.016 04/20/2019 1443   PHURINE 6.0 04/20/2019 1443   GLUCOSEU 50 (A) 04/20/2019 1443   HGBUR LARGE (A) 04/20/2019 1443   BILIRUBINUR NEGATIVE 04/20/2019 1443   KETONESUR NEGATIVE 04/20/2019 1443   PROTEINUR 100 (A) 04/20/2019 1443   NITRITE NEGATIVE 04/20/2019 1443   LEUKOCYTESUR NEGATIVE 04/20/2019 1443   Sepsis Labs Invalid input(s): PROCALCITONIN,  WBC,  LACTICIDVEN Microbiology Recent Results (from the past 240 hour(s))  Respiratory Panel by RT PCR (Flu A&B, Covid) - Urine, Clean Catch     Status: None   Collection Time: 04/20/19  2:43 PM   Specimen: Urine, Clean Catch  Result Value Ref Range  Status   SARS Coronavirus 2 by RT PCR NEGATIVE NEGATIVE Final    Comment: (NOTE) SARS-CoV-2 target nucleic acids are NOT DETECTED. The SARS-CoV-2 RNA is generally detectable in upper respiratoy specimens during the acute phase of infection. The lowest concentration of SARS-CoV-2 viral copies this assay can detect is 131 copies/mL. A negative result does not preclude SARS-Cov-2 infection and should not be used as the sole basis for treatment or other patient management decisions. A negative result may occur with  improper specimen collection/handling, submission of specimen other than nasopharyngeal swab, presence of viral mutation(s) within the areas targeted by this assay, and inadequate number of viral copies (<131 copies/mL). A negative result must be combined with clinical observations, patient history, and epidemiological information. The expected result is Negative. Fact Sheet for Patients:  https://www.moore.com/ Fact Sheet for Healthcare Providers:  https://www.young.biz/ This test is not yet ap proved or cleared by the [place] FDA and  has been authorized for detection and/or diagnosis of SARS-CoV-2 by FDA under an Emergency Use Authorization (EUA). This EUA will remain  in effect (meaning this test can be used) for the duration of the COVID-19 declaration under Section 564(b)(1) of the Act, 21 U.S.C. section 360bbb-3(b)(1), unless the authorization is terminated or revoked sooner.    Influenza A by PCR NEGATIVE NEGATIVE Final   Influenza B by PCR NEGATIVE NEGATIVE Final    Comment: (NOTE) The Xpert Xpress SARS-CoV-2/FLU/RSV assay is intended as an aid in  the diagnosis of influenza from Nasopharyngeal swab specimens and  should not be used as a sole basis for treatment. Nasal washings and  aspirates are unacceptable for Xpert Xpress SARS-CoV-2/FLU/RSV  testing. Fact Sheet for  Patients: https://www.moore.com/ Fact Sheet for Healthcare Providers: https://www.young.biz/ This test is not yet approved or cleared by the [place] FDA and  has been authorized for detection and/or diagnosis of SARS-CoV-2 by  FDA under an Emergency Use Authorization (EUA). This EUA will remain  in effect (meaning this test can be used) for the duration of the  Covid-19 declaration under Section 564(b)(1) of the Act, 21  U.S.C. section 360bbb-3(b)(1), unless the authorization  is  terminated or revoked. Performed at [facility], [address] [place], [place] [address]   Urine culture     Status: None   Collection Time: 04/20/19  2:43 PM   Specimen: Urine, Random  Result Value Ref Range Status   Specimen Description   Final    URINE, RANDOM Performed at [facility], [address] [place], [place] [address]    Special Requests   Final    NONE Performed at [facility], [address] [place], [place] [address]    Culture   Final    NO GROWTH Performed at [facility], [address] [place], [place] [address]    Report Status 04/22/2019 FINAL  Final     Time coordinating discharge: 25  minutes  SIGNED: Lanae Boastamesh Jayliana Valencia, MD  [facility] 04/26/2019, 9:43 AM  If 7PM-7AM, please contact night-coverage [email or web address]

## 2019-04-26 NOTE — Progress Notes (Signed)
Pt discharged to home with father. Discharge instructions and medication education provided to pt.

## 2019-04-30 NOTE — Progress Notes (Deleted)
Patient ID: Kathleen Gomez, female   DOB: 09-11-1960, 59 y.o.   MRN: [id number] After hospitalization 1/24-1/30/2021.  From discharge summary: Brief/Interim Summary: 59 year old female with history of HTN, DVT on Xarelto, lupus, THC abuse, tobacco use, COPD, current anxiety/depression, migraine, HLD, previous cholecystectomy presented to ER via EMS with altered mental status. Moved to [place] about a month ago from [place] to take care of her elderly father who is terminally ill with bladder cancer and severe COPD. Patient's father reported patient having nausea vomiting for last 3 to 4 days, poor oral intake and 1 day prior to admission became incoherent that worsened and EMS was called. In the ER CT head no acute finding, COVID-19 negative, CT abdomen pelvis no hydronephrosis. UA abnormal with WBC 11-20, with leukocytosis significant acute renal failure electrolyte imbalance, patient was admitted for further management. Patient was managed aggressively with IV fluid hydration, supportive measures with improvement in renal function.Renal function has returned to baseline. She also had MRI of the brain with changes likely reflective of chronic microvascular changes no concern of vasculitis or demyelinating process. She is ambulating well she is alert and oriented, back to baseline.  Patient was seen by PT  1/29 again and no further PT needs.  Patient will be discharged home.  Discharge Diagnoses:   AKI suspect ATN due to severe volume depletion poor oral intake:BUN/creatinine improved and has normalized.  Encourage oral hydration.  Follow-up with PCP.  Acute metabolic encephalopathy: Suspect multifactorial due to AKI, severe dehydrationMRI motion degraded, no acute finding, scattered T2 hyperintense signal, discussed with neurologist Dr Wilford Corner given her multiple risk factors suspect this is small vessel ischemic disease. Since patient's mental status has resolved no concern for other etiology like  infectious/inflammatory or demyelinatingprocess-neuroadvised no further work-up. Is nonfocal on exam. B12 on lower side continue B12 supplementation.  Essential hypertension: Blood pressurefairly stable continue home clonidine, added amlodipine 5 mg. Prolonged QTC: qtc 505, likely from electrolyte imbalance. Monitor lytes and repeat EKG repeat 1/28 shows QTC improved in 430s Intractable nausea vomiting: resolved. Suspect cannabinoid hyperemesis syndrome. CT abdomen pelvis no acute finding. UDS THC positive on admission. At this time resolved tolerating diet. Continue supportive care Hypokalemia-resolved she is on potassium supplementation -CONT SAME Hyponatremia hypovolemic- slightly low advised follow-up with PCP  Anemia likely from chronicdisease: Hemoglobin is stable in 10 to 11 g. Abnormal UAlikely from AKI, on admission also with leukocytosis urine culture no growth Rocephin discontinued. UTI ruled out. Abnormal LFTs-elevated AST ALT and total bili: Acute viral hepatitis panel negative. Level trending down and normalized Sinus tachycardialikely from dehydration resolved. History of DVT on xarelto at home:on admission on heparin infusion and now subsequently switched to Eliquis. Home Xarelto was discontinued due to poor renal function-since AKI resolved patient is back on Xarelto. Anxiety/depression/history of migraine: Continue home medication. HLD: Continue pravastatin.  COPDcontinue home inhalers. Substance abuse marijuana abuse tobacco use disorder: She has been advised to quit. Body mass index is 23.87 kg/m.

## 2019-05-01 ENCOUNTER — Ambulatory Visit: Payer: Medicare (Managed Care) | Attending: Family Medicine

## 2019-05-01 ENCOUNTER — Other Ambulatory Visit: Payer: Self-pay

## 2019-05-03 LAB — VITAMIN B1: Vitamin B1 (Thiamine): 62.2 nmol/L — ABNORMAL LOW (ref 66.5–200.0)

## 2019-05-16 ENCOUNTER — Encounter (HOSPITAL_COMMUNITY): Payer: Self-pay | Admitting: Emergency Medicine

## 2019-05-16 ENCOUNTER — Emergency Department (HOSPITAL_COMMUNITY)
Admission: EM | Admit: 2019-05-16 | Discharge: 2019-05-26 | Disposition: E | Payer: Medicare (Managed Care) | Attending: Emergency Medicine | Admitting: Emergency Medicine

## 2019-05-16 DIAGNOSIS — I469 Cardiac arrest, cause unspecified: Secondary | ICD-10-CM

## 2019-05-16 DIAGNOSIS — F17210 Nicotine dependence, cigarettes, uncomplicated: Secondary | ICD-10-CM | POA: Insufficient documentation

## 2019-05-16 DIAGNOSIS — Z79899 Other long term (current) drug therapy: Secondary | ICD-10-CM | POA: Diagnosis not present

## 2019-05-16 DIAGNOSIS — I10 Essential (primary) hypertension: Secondary | ICD-10-CM | POA: Diagnosis not present

## 2019-05-26 NOTE — ED Notes (Signed)
At 0126 CPR was paused for pulse check. No pulses were felt. Dr. Nicanor Alcon used ultrasound to view heart chambers at 0126. No activity noted. CPR stopped at 0127 (time of death).

## 2019-05-26 NOTE — ED Provider Notes (Signed)
[facility] EMERGENCY DEPARTMENT Provider Note   CSN: [id number] Arrival date & time: 2019-05-22  0123     History No chief complaint on file.   Kathleen Gomez is a 59 y.o. female.  The history is provided by the EMS personnel. The history is limited by the condition of the patient (cardiac arrest).  Cardiac Arrest Witnessed by:  Not witnessed Incident location: father's home. Time since incident:  90 minutes Time before ALS initiated:  8-10 minutes Pulse:  Absent Initial cardiac rhythm per EMS:  Asystole Treatments prior to arrival:  ACLS protocol and vascular access Medications given prior to ED:  Epinephrine, magnesium and amiodarone IV access type:  Intraosseous Airway: king airway  Rhythm on admission to ED:  Asystole Associated symptoms comment:  Unknown Risk factors comment:  H/o dvt 8 epis given, 2 grams of magnesium and 450 mg of amiodarone.  EMS worked the arrest for over an hour without pulses then restored pulse and patient en route and then they lost pulses again.  No pulses on arrival.       Past Medical History:  Diagnosis Date  . Depression   . H/O blood clots   . Hypertension   . Lupus [facility])     Patient Active Problem List   Diagnosis Date Noted  . AKI (acute kidney injury) ([place]) 04/20/2019  . Intractable nausea and vomiting 02/24/2019  . Nausea & vomiting 02/23/2019  . Hypokalemia 02/23/2019  . DVT (deep venous thrombosis) ([place]) 02/23/2019  . Anxiety 02/23/2019  . Lupus ([place]) 02/23/2019  . Migraine 02/23/2019    Past Surgical History:  Procedure Laterality Date  . APPENDECTOMY    . CHOLECYSTECTOMY       OB History   No obstetric history on file.     Family History  Problem Relation Age of Onset  . Diabetes Mellitus II Father   . Cancer Father   . Cancer Mother     Social History   Tobacco Use  . Smoking status: Current Every Day Smoker    Packs/day: 1.00    Types: Cigarettes  . Smokeless tobacco: Never Used    Substance Use Topics  . Alcohol use: Not Currently  . Drug use: Yes    Types: Marijuana    Home Medications Prior to Admission medications   Medication Sig Start Date End Date Taking? Authorizing Provider  albuterol (VENTOLIN HFA) 108 (90 Base) MCG/ACT inhaler Inhale 1 puff into the lungs every 6 (six) hours as needed for wheezing or shortness of breath. 02/25/19   Manuella Ghazi, Pratik D, DO  amLODipine (NORVASC) 5 MG tablet Take 1 tablet (5 mg total) by mouth daily. 04/26/19 05/26/19  Antonieta Pert, MD  cloNIDine (CATAPRES) 0.1 MG tablet Take 1 tablet (0.1 mg total) by mouth 2 (two) times daily. 04/25/19 05/25/19  Antonieta Pert, MD  escitalopram (LEXAPRO) 10 MG tablet Take 1 tablet (10 mg total) by mouth daily. 02/25/19 03/27/19  Manuella Ghazi, Pratik D, DO  gabapentin (NEURONTIN) 600 MG tablet Take 1 tablet (600 mg total) by mouth 3 (three) times daily. 02/25/19 03/27/19  Manuella Ghazi, Pratik D, DO  mirtazapine (REMERON) 45 MG tablet Take 1 tablet (45 mg total) by mouth at bedtime. 02/25/19 03/27/19  Manuella Ghazi, Pratik D, DO  potassium chloride (MICRO-K) 10 MEQ CR capsule Take 1 capsule (10 mEq total) by mouth daily. 02/25/19 03/27/19  Manuella Ghazi, Pratik D, DO  pravastatin (PRAVACHOL) 10 MG tablet Take 1 tablet (10 mg total) by mouth at bedtime. 02/25/19 03/27/19  Sherryll Burger, Pratik D, DO  promethazine (PHENERGAN) 25 MG suppository Place 1 suppository (25 mg total) rectally every 8 (eight) hours as needed for nausea or vomiting. Patient not taking: Reported on 04/20/2019 03/02/19   Eustace Moore, MD  SPIRIVA HANDIHALER 18 MCG inhalation capsule Place 1 capsule (18 mcg total) into inhaler and inhale daily. Patient not taking: Reported on 04/20/2019 02/25/19   Maurilio Lovely D, DO  SYMBICORT 80-4.5 MCG/ACT inhaler Inhale 1 puff into the lungs 2 (two) times daily. Patient not taking: Reported on 04/20/2019 02/25/19   Maurilio Lovely D, DO  topiramate (TOPAMAX) 25 MG tablet Take 1 tablet (25 mg total) by mouth at bedtime. 02/25/19 03/27/19  Sherryll Burger, Pratik D, DO   vitamin B-12 100 MCG tablet Take 1 tablet (100 mcg total) by mouth daily. 04/25/19 05/25/19  Lanae Boast, MD  XARELTO 20 MG TABS tablet Take 1 tablet (20 mg total) by mouth daily. 02/25/19 03/27/19  Maurilio Lovely D, DO    Allergies    Patient has no known allergies.  Review of Systems   Review of Systems  Unable to perform ROS: Acuity of condition (cardiac arrest)  Skin: Negative for wound.    Physical Exam Updated Vital Signs LMP  (LMP Unknown)   Physical Exam Vitals and nursing note reviewed.  Constitutional:      Appearance: She is normal weight.     Comments: Unresponsive   HENT:     Head: Normocephalic.     Nose: Nose normal.  Eyes:     Conjunctiva/sclera: Conjunctivae normal.  Cardiovascular:     Comments: No activity on [place], pulses absent  Pulmonary:     Comments: Rhonchi with bagging Abdominal:     General: Abdomen is flat.  Musculoskeletal:        General: No deformity.     Cervical back: Neck supple.  Lymphadenopathy:     Cervical: No cervical adenopathy.  Skin:    General: Skin is dry.     Coloration: Skin is pale.  Neurological:     GCS: GCS eye subscore is 1. GCS verbal subscore is 1. GCS motor subscore is 1.  Psychiatric:     Comments: Unable      ED Results / Procedures / Treatments   Labs (all labs ordered are listed, but only abnormal results are displayed) Labs Reviewed - No data to display  EKG None  Radiology No results found.  Procedures Procedures (including critical care time)  Medications Ordered in ED Medications - No data to display  No pulses no cardiac activity on [place].  TOD 127 am  Final Clinical Impression(s) / ED Diagnoses Cardiac arrest, accepted by ME Dr. Burke Keels, Gerardo Territo, MD 06-07-19 [phone]

## 2019-05-26 NOTE — ED Triage Notes (Signed)
[facility] arrived on scene to find pt unresponsive, fully clothed, and incontinent between toilet and bathtub at home. Family reported downtime of approx 15 min pre arrival without bystander CPR. Initial rhythm at 2355 was asystole. CPR was initiated by EMS. Rhythm was later reported as vfib and 8 shocks, 16 epi, 450 amiodarone, and 2 g Mg were pushed. EMS called to discontinue CPR at approx 0050 and orders were given. Pulses were felt immediately afterward and orders to transport were given. At approx 0100 pt went into asystole and CPR was re-initiated. Arrived to [facility] at 0122 with CPR in progress.

## 2019-05-26 DEATH — deceased

## 2021-09-18 IMAGING — MR MR HEAD W/O CM
10 of 11 series · 34 of 48 positions shown · non-contrast
Comparison: Head CT 04/20/2019

CLINICAL DATA: Encephalopathy. Additional history provided: Patient
with history of hypertension, DVT on Xarelto, lupus, THC abuse,
tobacco use, COPD, current anxiety/depression, migraine,
hyperlipidemia, previous cholecystectomy presenting with altered
mental status.

EXAM:
MRI HEAD WITHOUT CONTRAST
TECHNIQUE: Multiplanar, multiecho pulse sequences of the brain and surrounding
structures were obtained without intravenous contrast.

[Series 3: DWI · axial · 3.0mm · 1.09mm/px · z∈[-77,+75]mm · 8 of 104 slices shown (1 of 4)]
[im 1/104]
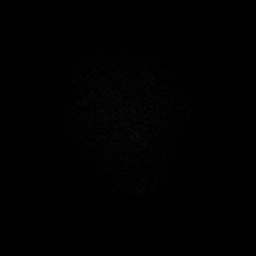
[im 15/104]
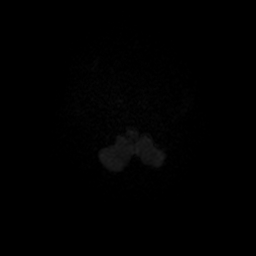
[im 30/104]
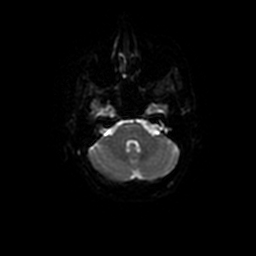
[im 45/104]
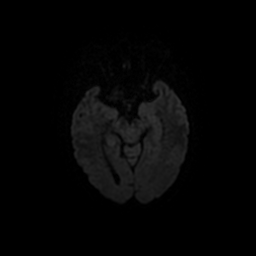
[im 59/104]
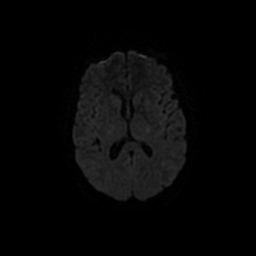
[im 74/104]
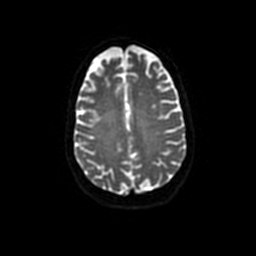
[im 89/104]
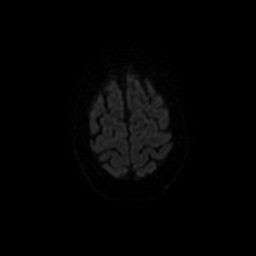
[im 104/104]
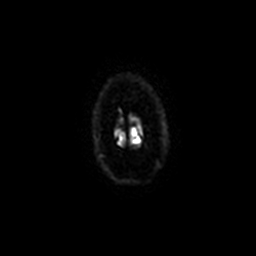

[Series 4: T1 · sagittal · 5.0mm · 0.47mm/px · 2 of 19 slices shown (1 of 2)]
[im 1/19]
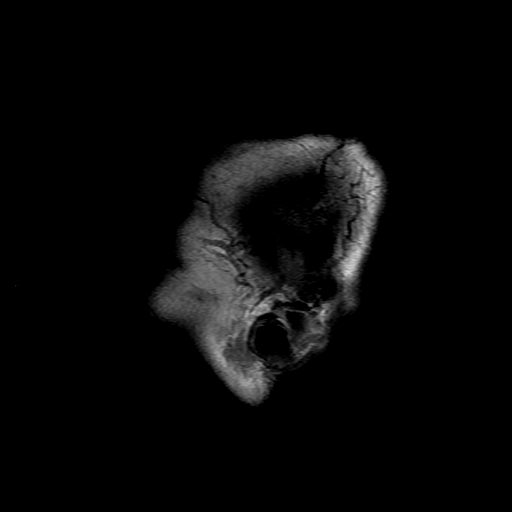
[im 19/19]
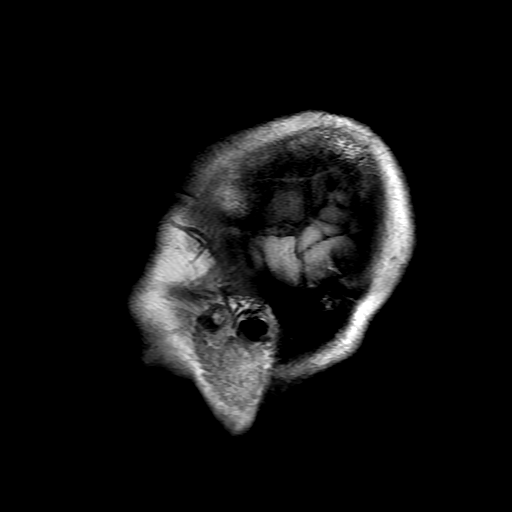

[Series 5: T2 · axial · 5.0mm · 0.43mm/px · z∈[-73,+73]mm · 2 of 22 slices shown (1 of 3)]
[im 1/22]
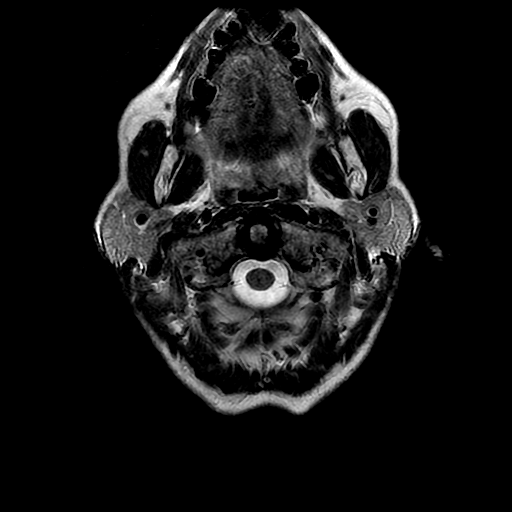
[im 22/22]
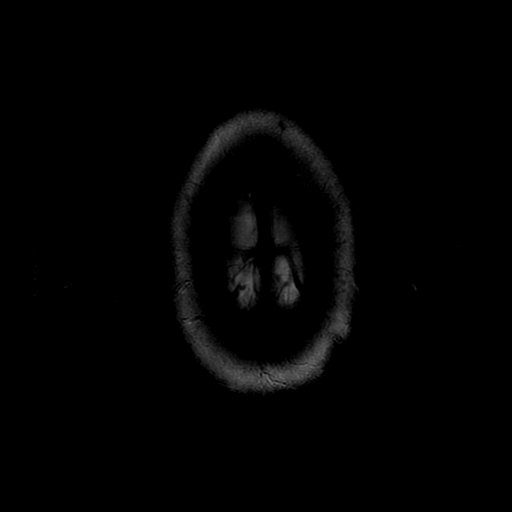

[Series 6: T2 · axial · 5.0mm · 0.43mm/px · z∈[-75,+74]mm · 2 of 26 slices shown (2 of 3)]
[im 1/26]
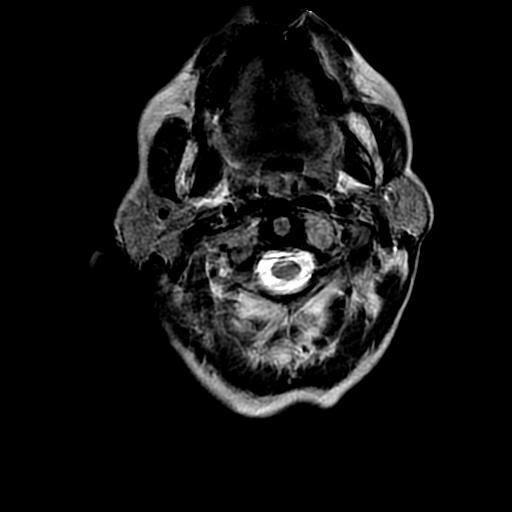
[im 26/26]
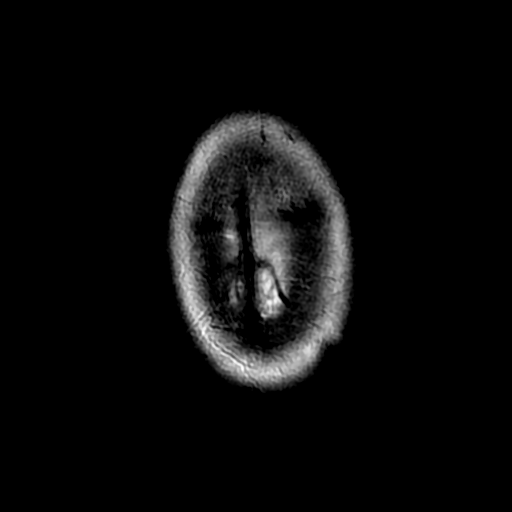

[Series 7: FLAIR · axial · 5.0mm · 0.43mm/px · z∈[-75,+74]mm · 2 of 26 slices shown]
[im 1/26]
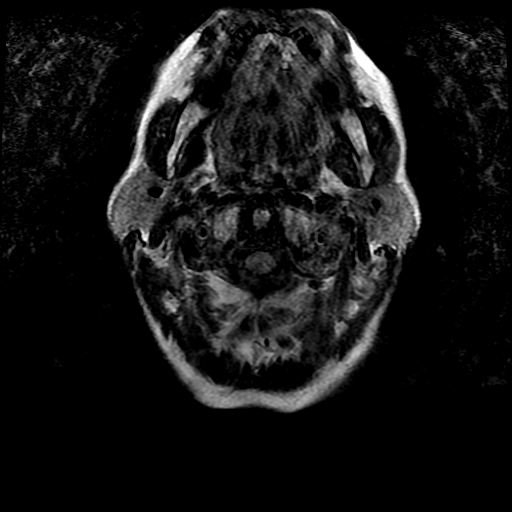
[im 26/26]
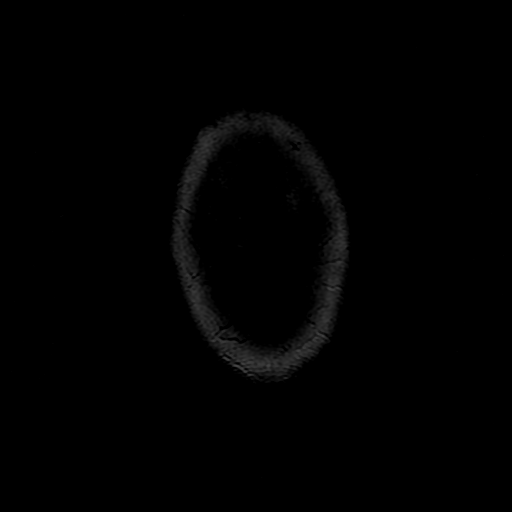

[Series 9: T1 · axial · 1.0mm · 0.47mm/px · 1 of 144 slices shown (2 of 2)]
[im 1/144]
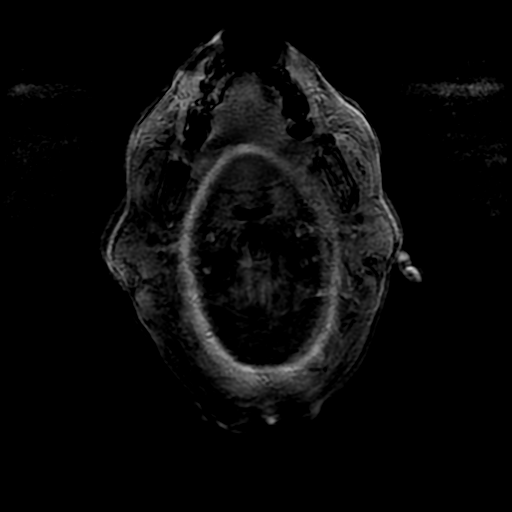

[Series 10: DWI · coronal · 4.0mm · 1.09mm/px · 7 of 84 slices shown (2 of 4)]
[im 1/84]
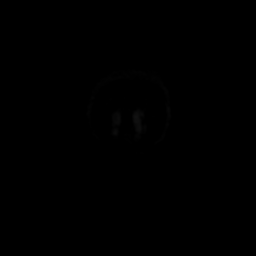
[im 14/84]
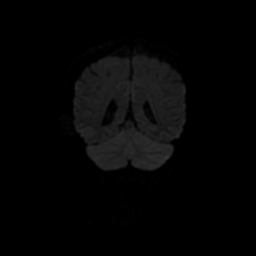
[im 28/84]
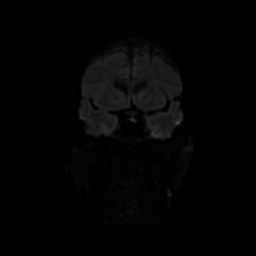
[im 42/84]
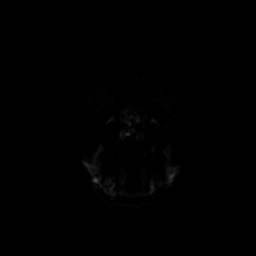
[im 56/84]
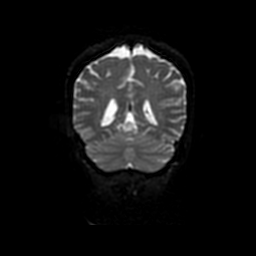
[im 70/84]
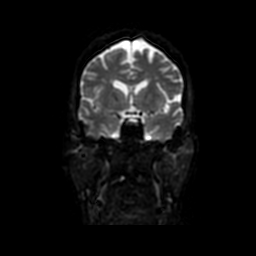
[im 84/84]
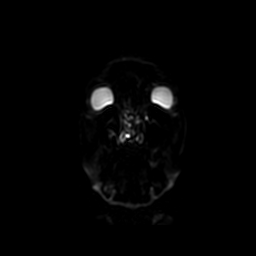

[Series 11: T2 · coronal · 5.0mm · 0.47mm/px · 2 of 27 slices shown (3 of 3)]
[im 1/27]
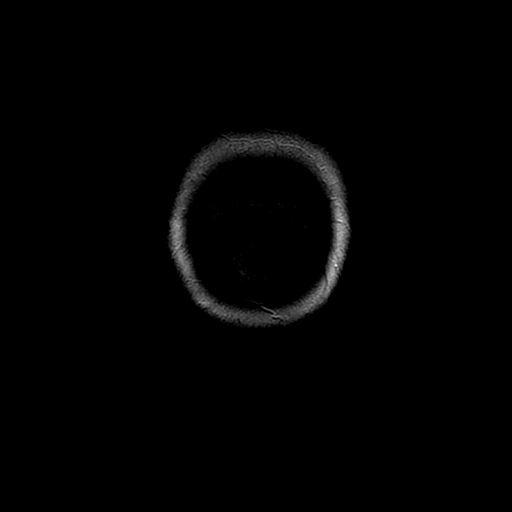
[im 27/27]
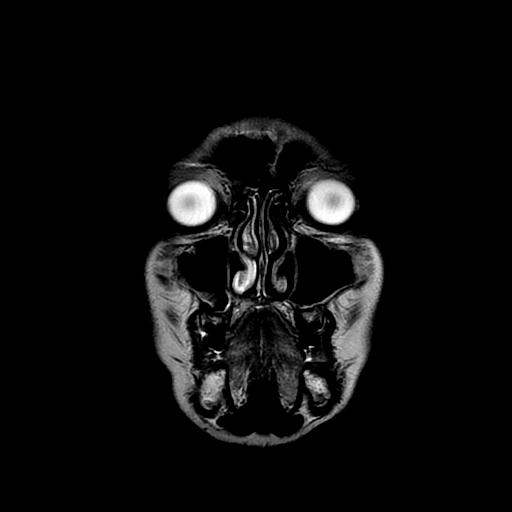

[Series 300: DWI · axial · 3.0mm · 1.09mm/px · z∈[-77,+75]mm · 4 of 50 slices shown (3 of 4)]
[im 1/50]
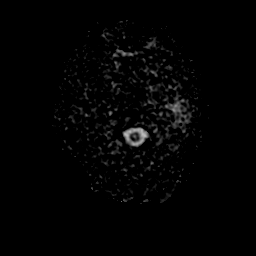
[im 17/50]
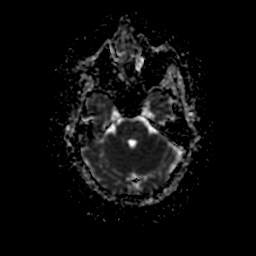
[im 33/50]
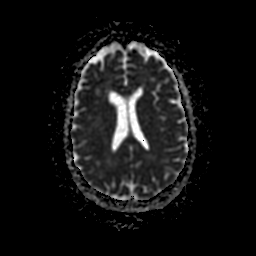
[im 50/50]
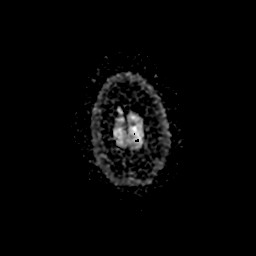

[Series 1000: DWI · coronal · 4.0mm · 1.09mm/px · 4 of 42 slices shown (4 of 4)]
[im 1/42]
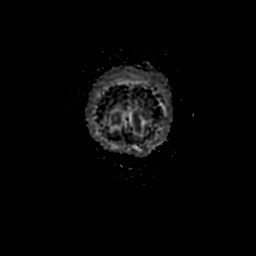
[im 14/42]
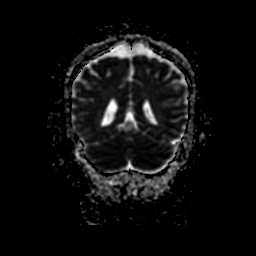
[im 28/42]
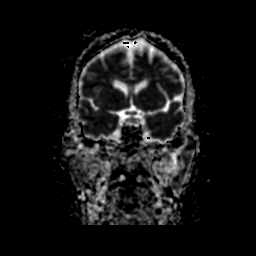
[im 42/42]
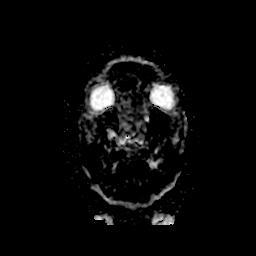

[34 of 48 positions shown; findings below may reference images not displayed]

FINDINGS: Brain:

The examination is motion degraded, limiting evaluation. Most
notably there is moderate motion degradation of the axial T2
weighted sequence, moderate/severe motion degradation of the axial
T2 FLAIR sequence and moderate/severe motion degradation of the
axial T1 weighted sequence.

There is no evidence of acute infarct.

No evidence of intracranial mass.

No midline shift or extra-axial fluid collection.

Punctate chronic microhemorrhage within the right parietal white
matter (series 8, image 21).

Scattered T2/FLAIR hyperintensity within the cerebral white matter
is advanced for age and nonspecific. Mild ill-defined T2/FLAIR
hyperintensity is also present within the pons.

Cerebral volume is normal for age.  Partially empty sella turcica.

Vascular: Flow voids maintained within the proximal large arterial
vessels.

Skull and upper cervical spine: No focal marrow lesion

Sinuses/Orbits: Visualized orbits demonstrate no acute abnormality.
Moderate-sized right maxillary sinus mucous retention cyst. Trace
fluid within bilateral mastoid air cells.
IMPRESSION: Motion degraded and limited examination as described.

No evidence of acute intracranial abnormality.

Scattered T2 hyperintense signal changes within the cerebral white
matter and pons are advanced for age. Findings are nonspecific but
most commonly seen on the basis of chronic small vessel ischemic
disease. Potential alternative etiologies include vasculitis,
sequela of a prior infectious/inflammatory process or a
demyelinating process such as multiple sclerosis, among others.
